# Patient Record
Sex: Female | Born: 1985
Health system: Southern US, Community
[De-identification: ages and names within clinical notes are randomized; demographics above are authoritative.]

## PROBLEM LIST (undated history)

## (undated) DIAGNOSIS — A630 Anogenital (venereal) warts: Secondary | ICD-10-CM

## (undated) DIAGNOSIS — F419 Anxiety disorder, unspecified: Secondary | ICD-10-CM

## (undated) DIAGNOSIS — K219 Gastro-esophageal reflux disease without esophagitis: Secondary | ICD-10-CM

## (undated) HISTORY — DX: Anxiety disorder, unspecified: F41.9

## (undated) HISTORY — DX: Anogenital (venereal) warts: A63.0

## (undated) HISTORY — PX: WISDOM TOOTH EXTRACTION: SHX21

---

## 2010-09-15 ENCOUNTER — Emergency Department: Payer: Self-pay | Admitting: Internal Medicine

## 2012-06-08 ENCOUNTER — Emergency Department (HOSPITAL_COMMUNITY): Payer: BC Managed Care – PPO

## 2012-06-08 ENCOUNTER — Encounter (HOSPITAL_COMMUNITY): Payer: Self-pay

## 2012-06-08 ENCOUNTER — Inpatient Hospital Stay (HOSPITAL_COMMUNITY)
Admission: EM | Admit: 2012-06-08 | Discharge: 2012-06-12 | DRG: 733 | Disposition: A | Discharge status: Rehabilitation Facility | Payer: BC Managed Care – PPO | Attending: Emergency Medicine | Admitting: Emergency Medicine

## 2012-06-08 DIAGNOSIS — S22009A Unspecified fracture of unspecified thoracic vertebra, initial encounter for closed fracture: Secondary | ICD-10-CM | POA: Diagnosis present

## 2012-06-08 DIAGNOSIS — S0181XA Laceration without foreign body of other part of head, initial encounter: Secondary | ICD-10-CM | POA: Diagnosis present

## 2012-06-08 DIAGNOSIS — S06369A Traumatic hemorrhage of cerebrum, unspecified, with loss of consciousness of unspecified duration, initial encounter: Secondary | ICD-10-CM | POA: Diagnosis present

## 2012-06-08 DIAGNOSIS — S0180XA Unspecified open wound of other part of head, initial encounter: Secondary | ICD-10-CM | POA: Diagnosis present

## 2012-06-08 DIAGNOSIS — S060X9A Concussion with loss of consciousness of unspecified duration, initial encounter: Secondary | ICD-10-CM

## 2012-06-08 DIAGNOSIS — S3681XA Injury of peritoneum, initial encounter: Principal | ICD-10-CM | POA: Diagnosis present

## 2012-06-08 DIAGNOSIS — S36115A Moderate laceration of liver, initial encounter: Secondary | ICD-10-CM | POA: Diagnosis present

## 2012-06-08 DIAGNOSIS — S2249XA Multiple fractures of ribs, unspecified side, initial encounter for closed fracture: Secondary | ICD-10-CM | POA: Diagnosis present

## 2012-06-08 DIAGNOSIS — Y998 Other external cause status: Secondary | ICD-10-CM

## 2012-06-08 DIAGNOSIS — S2243XA Multiple fractures of ribs, bilateral, initial encounter for closed fracture: Secondary | ICD-10-CM | POA: Diagnosis present

## 2012-06-08 DIAGNOSIS — S06330A Contusion and laceration of cerebrum, unspecified, without loss of consciousness, initial encounter: Secondary | ICD-10-CM | POA: Diagnosis present

## 2012-06-08 DIAGNOSIS — S36899A Unspecified injury of other intra-abdominal organs, initial encounter: Secondary | ICD-10-CM

## 2012-06-08 DIAGNOSIS — D62 Acute posthemorrhagic anemia: Secondary | ICD-10-CM | POA: Diagnosis present

## 2012-06-08 DIAGNOSIS — Z1881 Retained glass fragments: Secondary | ICD-10-CM

## 2012-06-08 DIAGNOSIS — S0636AA Traumatic hemorrhage of cerebrum, unspecified, with loss of consciousness status unknown, initial encounter: Secondary | ICD-10-CM | POA: Diagnosis present

## 2012-06-08 DIAGNOSIS — S060XAA Concussion with loss of consciousness status unknown, initial encounter: Secondary | ICD-10-CM

## 2012-06-08 DIAGNOSIS — S272XXA Traumatic hemopneumothorax, initial encounter: Secondary | ICD-10-CM | POA: Diagnosis present

## 2012-06-08 DIAGNOSIS — S20229A Contusion of unspecified back wall of thorax, initial encounter: Secondary | ICD-10-CM | POA: Diagnosis present

## 2012-06-08 DIAGNOSIS — E041 Nontoxic single thyroid nodule: Secondary | ICD-10-CM | POA: Diagnosis present

## 2012-06-08 DIAGNOSIS — S41009A Unspecified open wound of unspecified shoulder, initial encounter: Secondary | ICD-10-CM | POA: Diagnosis present

## 2012-06-08 DIAGNOSIS — I959 Hypotension, unspecified: Secondary | ICD-10-CM

## 2012-06-08 DIAGNOSIS — S27329A Contusion of lung, unspecified, initial encounter: Secondary | ICD-10-CM | POA: Diagnosis present

## 2012-06-08 DIAGNOSIS — S32009A Unspecified fracture of unspecified lumbar vertebra, initial encounter for closed fracture: Secondary | ICD-10-CM | POA: Diagnosis present

## 2012-06-08 DIAGNOSIS — S37069A Major laceration of unspecified kidney, initial encounter: Secondary | ICD-10-CM | POA: Diagnosis present

## 2012-06-08 DIAGNOSIS — S36113A Laceration of liver, unspecified degree, initial encounter: Secondary | ICD-10-CM

## 2012-06-08 DIAGNOSIS — Y92410 Unspecified street and highway as the place of occurrence of the external cause: Secondary | ICD-10-CM

## 2012-06-08 DIAGNOSIS — S37001A Unspecified injury of right kidney, initial encounter: Secondary | ICD-10-CM | POA: Diagnosis present

## 2012-06-08 DIAGNOSIS — S01119A Laceration without foreign body of unspecified eyelid and periocular area, initial encounter: Secondary | ICD-10-CM

## 2012-06-08 LAB — CBC WITH DIFFERENTIAL/PLATELET
Basophils Absolute: 0 K/uL (ref 0.0–0.1)
Basophils Relative: 0 % (ref 0–1)
Eosinophils Absolute: 0.1 K/uL (ref 0.0–0.7)
Eosinophils Relative: 1 % (ref 0–5)
HCT: 33 % — ABNORMAL LOW (ref 36.0–46.0)
Hemoglobin: 10.5 g/dL — ABNORMAL LOW (ref 12.0–15.0)
Lymphocytes Relative: 38 % (ref 12–46)
Lymphs Abs: 4 K/uL (ref 0.7–4.0)
MCH: 28.5 pg (ref 26.0–34.0)
MCHC: 31.8 g/dL (ref 30.0–36.0)
MCV: 89.4 fL (ref 78.0–100.0)
Monocytes Absolute: 0.4 K/uL (ref 0.1–1.0)
Monocytes Relative: 4 % (ref 3–12)
Neutro Abs: 6 K/uL (ref 1.7–7.7)
Neutrophils Relative %: 57 % (ref 43–77)
Platelets: 237 K/uL (ref 150–400)
RBC: 3.69 MIL/uL — ABNORMAL LOW (ref 3.87–5.11)
RDW: 13.1 % (ref 11.5–15.5)
WBC: 10.5 K/uL (ref 4.0–10.5)

## 2012-06-08 LAB — URINE MICROSCOPIC-ADD ON

## 2012-06-08 LAB — URINALYSIS, ROUTINE W REFLEX MICROSCOPIC
Bilirubin Urine: NEGATIVE
Glucose, UA: NEGATIVE mg/dL
Ketones, ur: NEGATIVE mg/dL
Leukocytes, UA: NEGATIVE
Nitrite: NEGATIVE
Protein, ur: NEGATIVE mg/dL
Specific Gravity, Urine: <1.005 — ABNORMAL LOW (ref 1.005–1.030)
Urobilinogen, UA: 0.2 mg/dL (ref 0.0–1.0)
pH: 5.5 (ref 5.0–8.0)

## 2012-06-08 LAB — ETHANOL: Alcohol, Ethyl (B): <11 mg/dL (ref 0–11)

## 2012-06-08 LAB — COMPREHENSIVE METABOLIC PANEL
ALT: 309 U/L — ABNORMAL HIGH (ref 0–35)
AST: 361 U/L — ABNORMAL HIGH (ref 0–37)
Calcium: 8.4 mg/dL (ref 8.4–10.5)
Creatinine, Ser: 0.95 mg/dL (ref 0.50–1.10)
Sodium: 135 mEq/L (ref 135–145)
Total Protein: 6.8 g/dL (ref 6.0–8.3)

## 2012-06-08 LAB — RAPID URINE DRUG SCREEN, HOSP PERFORMED
Amphetamines: NOT DETECTED
Cocaine: NOT DETECTED
Opiates: POSITIVE — AB
Tetrahydrocannabinol: NOT DETECTED

## 2012-06-08 LAB — POCT I-STAT, CHEM 8
Chloride: 104 meq/L (ref 96–112)
HCT: 36 % (ref 36.0–46.0)
Hemoglobin: 12.2 g/dL (ref 12.0–15.0)
Potassium: 4.2 meq/L (ref 3.5–5.1)
Sodium: 137 meq/L (ref 135–145)

## 2012-06-08 LAB — PREPARE RBC (CROSSMATCH)

## 2012-06-08 LAB — MRSA PCR SCREENING: MRSA by PCR: NEGATIVE

## 2012-06-08 MED ORDER — SODIUM CHLORIDE 0.9 % IV SOLN
Freq: Once | INTRAVENOUS | Status: AC
  Administered 2012-06-08: 999 mL via INTRAVENOUS

## 2012-06-08 MED ORDER — DIPHENHYDRAMINE HCL 25 MG PO CAPS
25.0000 mg | ORAL_CAPSULE | Freq: Four times a day (QID) | ORAL | Status: DC | PRN
  Filled 2012-06-08: qty 2

## 2012-06-08 MED ORDER — PSYLLIUM 95 % PO PACK
1.0000 | PACK | Freq: Every day | ORAL | Status: DC
  Administered 2012-06-09 – 2012-06-11 (×3): 1 via ORAL
  Filled 2012-06-08 (×5): qty 1

## 2012-06-08 MED ORDER — MORPHINE SULFATE 2 MG/ML IJ SOLN
1.0000 mg | INTRAMUSCULAR | Status: DC | PRN
  Administered 2012-06-08 – 2012-06-09 (×8): 2 mg via INTRAVENOUS
  Filled 2012-06-08 (×10): qty 1

## 2012-06-08 MED ORDER — WHITE PETROLATUM GEL
Status: AC
  Filled 2012-06-08: qty 5

## 2012-06-08 MED ORDER — ONDANSETRON HCL 4 MG/2ML IJ SOLN
INTRAMUSCULAR | Status: AC
  Administered 2012-06-08: 4 mg via INTRAVENOUS
  Filled 2012-06-08: qty 2

## 2012-06-08 MED ORDER — DIPHENHYDRAMINE HCL 50 MG/ML IJ SOLN: 12.5000 mg | Freq: Four times a day (QID) | INTRAMUSCULAR | Status: DC | PRN

## 2012-06-08 MED ORDER — MORPHINE SULFATE 2 MG/ML IJ SOLN
INTRAMUSCULAR | Status: AC
  Administered 2012-06-08: 4 mg via INTRAVENOUS
  Filled 2012-06-08: qty 2

## 2012-06-08 MED ORDER — IOHEXOL 300 MG/ML  SOLN
100.0000 mL | Freq: Once | INTRAMUSCULAR | Status: AC | PRN
  Administered 2012-06-08: 100 mL via INTRAVENOUS

## 2012-06-08 MED ORDER — LIDOCAINE HCL (CARDIAC) 20 MG/ML IV SOLN
INTRAVENOUS | Status: AC
  Filled 2012-06-08: qty 5

## 2012-06-08 MED ORDER — SUCCINYLCHOLINE CHLORIDE 20 MG/ML IJ SOLN
INTRAMUSCULAR | Status: AC
  Filled 2012-06-08: qty 1

## 2012-06-08 MED ORDER — ONDANSETRON HCL 4 MG PO TABS: 4.0000 mg | ORAL_TABLET | Freq: Four times a day (QID) | ORAL | Status: DC | PRN

## 2012-06-08 MED ORDER — ROCURONIUM BROMIDE 50 MG/5ML IV SOLN
INTRAVENOUS | Status: AC
  Filled 2012-06-08: qty 2

## 2012-06-08 MED ORDER — ONDANSETRON HCL 4 MG/2ML IJ SOLN: 4.0000 mg | Freq: Four times a day (QID) | INTRAMUSCULAR | Status: DC | PRN

## 2012-06-08 MED ORDER — POTASSIUM CHLORIDE IN NACL 20-0.9 MEQ/L-% IV SOLN
INTRAVENOUS | Status: DC
  Administered 2012-06-08 – 2012-06-10 (×6): via INTRAVENOUS
  Filled 2012-06-08 (×12): qty 1000

## 2012-06-08 MED ORDER — DOCUSATE SODIUM 100 MG PO CAPS
100.0000 mg | ORAL_CAPSULE | Freq: Two times a day (BID) | ORAL | Status: DC
  Administered 2012-06-08 – 2012-06-12 (×6): 100 mg via ORAL
  Filled 2012-06-08 (×10): qty 1

## 2012-06-08 MED ORDER — ETOMIDATE 2 MG/ML IV SOLN
INTRAVENOUS | Status: AC
  Filled 2012-06-08: qty 20

## 2012-06-08 NOTE — Progress Notes (Signed)
Rehab Admissions Coordinator Note:  Patient was screened by Clois Dupes for appropriateness for an Inpatient Acute Rehab Consult.  At this time, we are recommending  Await further progress before determining rehab venue needs. Pt just admitted today.   Clois Dupes, RN 06/08/2012, 3:56 PM  I can be reached at [phone].

## 2012-06-08 NOTE — ED Notes (Signed)
See Trauma Narrator
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
## 2012-06-08 NOTE — ED Notes (Signed)
PA into suture rt eye lac
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
## 2012-06-08 NOTE — ED Notes (Signed)
Back from xray family into see pt, first unit blood still infusing
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
## 2012-06-08 NOTE — ED Notes (Signed)
Emergency Release Blood started, Unit number [id number], verified with Arline Asp and Lequita Halt RN

## 2012-06-08 NOTE — ED Notes (Signed)
Vital signs stable. 
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
## 2012-06-09 ENCOUNTER — Inpatient Hospital Stay (HOSPITAL_COMMUNITY): Payer: BC Managed Care – PPO

## 2012-06-09 LAB — COMPREHENSIVE METABOLIC PANEL
ALT: 215 U/L — ABNORMAL HIGH (ref 0–35)
AST: 246 U/L — ABNORMAL HIGH (ref 0–37)
Albumin: 2.6 g/dL — ABNORMAL LOW (ref 3.5–5.2)
Alkaline Phosphatase: 40 U/L (ref 39–117)
CO2: 23 mEq/L (ref 19–32)
Chloride: 104 mEq/L (ref 96–112)
GFR calc non Af Amer: >90 mL/min (ref >90)
Potassium: 3.9 mEq/L (ref 3.5–5.1)
Sodium: 135 mEq/L (ref 135–145)
Total Bilirubin: 0.6 mg/dL (ref 0.3–1.2)

## 2012-06-09 LAB — TYPE AND SCREEN
Antibody Screen: NEGATIVE
Unit division: 0

## 2012-06-09 LAB — CBC
MCV: 86.9 fL (ref 78.0–100.0)
Platelets: 120 10*3/uL — ABNORMAL LOW (ref 150–400)
RBC: 3.06 MIL/uL — ABNORMAL LOW (ref 3.87–5.11)
RDW: 13.5 % (ref 11.5–15.5)
WBC: 7.2 10*3/uL (ref 4.0–10.5)

## 2012-06-09 MED ORDER — OXYCODONE-ACETAMINOPHEN 5-325 MG PO TABS
2.0000 | ORAL_TABLET | ORAL | Status: DC | PRN
  Administered 2012-06-09 – 2012-06-11 (×4): 2 via ORAL
  Filled 2012-06-09 (×4): qty 2

## 2012-06-09 MED ORDER — OXYCODONE HCL 5 MG PO TABS
5.0000 mg | ORAL_TABLET | ORAL | Status: DC | PRN
  Administered 2012-06-11: 5 mg via ORAL
  Filled 2012-06-09: qty 1

## 2012-06-09 NOTE — Progress Notes (Signed)
UR completed 
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
## 2012-06-09 NOTE — Progress Notes (Signed)
Patient ID: [name] [name], female   DOB: 08-15-1985, 26 y.o.   MRN: [id number] BP 124/55  Pulse 90  Temp 99.1 F (37.3 C) (Oral)  Resp 20  Ht 5\' 4"  (1.626 m)  Wt 93.8 kg (206 lb 12.7 oz)  BMI 35.50 kg/m2  SpO2 99%  LMP 05/26/2012 Alert and oriented x 4, speech is clear and fluent Perrl, full eom Symmetric facies, tongue and uvula midline Moving all extremities. Weakness expected in left lower extremity due to psoas damage and swelling on left.
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
## 2012-06-10 ENCOUNTER — Inpatient Hospital Stay (HOSPITAL_COMMUNITY): Payer: BC Managed Care – PPO

## 2012-06-10 LAB — CBC
MCV: 87.8 fL (ref 78.0–100.0)
Platelets: 123 10*3/uL — ABNORMAL LOW (ref 150–400)
RBC: 2.96 MIL/uL — ABNORMAL LOW (ref 3.87–5.11)
RDW: 13.6 % (ref 11.5–15.5)
WBC: 7.2 10*3/uL (ref 4.0–10.5)

## 2012-06-10 LAB — COMPREHENSIVE METABOLIC PANEL
ALT: 164 U/L — ABNORMAL HIGH (ref 0–35)
AST: 141 U/L — ABNORMAL HIGH (ref 0–37)
Albumin: 2.7 g/dL — ABNORMAL LOW (ref 3.5–5.2)
Alkaline Phosphatase: 46 U/L (ref 39–117)
CO2: 23 mEq/L (ref 19–32)
Chloride: 105 mEq/L (ref 96–112)
GFR calc non Af Amer: >90 mL/min (ref >90)
Potassium: 3.8 mEq/L (ref 3.5–5.1)
Total Bilirubin: 0.6 mg/dL (ref 0.3–1.2)

## 2012-06-10 LAB — URINALYSIS, MICROSCOPIC ONLY
Nitrite: POSITIVE — AB
Specific Gravity, Urine: 1.008 (ref 1.005–1.030)
Urobilinogen, UA: 1 mg/dL (ref 0.0–1.0)
pH: 6 (ref 5.0–8.0)

## 2012-06-10 MED ORDER — IPRATROPIUM BROMIDE 0.02 % IN SOLN
0.5000 mg | RESPIRATORY_TRACT | Status: DC
  Administered 2012-06-10 – 2012-06-11 (×5): 0.5 mg via RESPIRATORY_TRACT
  Filled 2012-06-10 (×5): qty 2.5

## 2012-06-10 MED ORDER — ALPRAZOLAM 0.25 MG PO TABS: 0.2500 mg | ORAL_TABLET | Freq: Three times a day (TID) | ORAL | Status: DC | PRN

## 2012-06-10 MED ORDER — ALBUTEROL SULFATE (5 MG/ML) 0.5% IN NEBU
2.5000 mg | INHALATION_SOLUTION | RESPIRATORY_TRACT | Status: DC
  Administered 2012-06-10 – 2012-06-11 (×5): 2.5 mg via RESPIRATORY_TRACT
  Filled 2012-06-10 (×5): qty 0.5

## 2012-06-10 NOTE — Progress Notes (Signed)
Agree with A&P of MD,PA . Patient seen with her. Can advance diet
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
## 2012-06-11 DIAGNOSIS — I619 Nontraumatic intracerebral hemorrhage, unspecified: Secondary | ICD-10-CM

## 2012-06-11 LAB — COMPREHENSIVE METABOLIC PANEL
AST: 90 U/L — ABNORMAL HIGH (ref 0–37)
CO2: 23 mEq/L (ref 19–32)
Calcium: 8.5 mg/dL (ref 8.4–10.5)
Creatinine, Ser: 0.57 mg/dL (ref 0.50–1.10)
GFR calc non Af Amer: >90 mL/min (ref >90)
Sodium: 137 mEq/L (ref 135–145)
Total Protein: 6.4 g/dL (ref 6.0–8.3)

## 2012-06-11 LAB — URINE CULTURE: Colony Count: >=100000

## 2012-06-11 LAB — CBC
MCH: 29.5 pg (ref 26.0–34.0)
MCHC: 33.6 g/dL (ref 30.0–36.0)
MCV: 87.8 fL (ref 78.0–100.0)
Platelets: 138 10*3/uL — ABNORMAL LOW (ref 150–400)
RDW: 13.5 % (ref 11.5–15.5)

## 2012-06-11 LAB — HEPATIC FUNCTION PANEL
Indirect Bilirubin: 0.4 mg/dL (ref 0.3–0.9)
Total Protein: 6.4 g/dL (ref 6.0–8.3)

## 2012-06-11 MED ORDER — CIPROFLOXACIN HCL 500 MG PO TABS
500.0000 mg | ORAL_TABLET | Freq: Two times a day (BID) | ORAL | Status: DC
  Administered 2012-06-11 – 2012-06-12 (×3): 500 mg via ORAL
  Filled 2012-06-11 (×5): qty 1

## 2012-06-11 MED ORDER — ALBUTEROL SULFATE (5 MG/ML) 0.5% IN NEBU: 2.5000 mg | INHALATION_SOLUTION | RESPIRATORY_TRACT | Status: DC

## 2012-06-11 MED ORDER — METHOCARBAMOL 500 MG PO TABS: 500.0000 mg | ORAL_TABLET | Freq: Three times a day (TID) | ORAL | Status: DC | PRN

## 2012-06-11 MED ORDER — IPRATROPIUM BROMIDE 0.02 % IN SOLN
0.5000 mg | Freq: Two times a day (BID) | RESPIRATORY_TRACT | Status: DC
  Administered 2012-06-11: 0.5 mg via RESPIRATORY_TRACT
  Filled 2012-06-11 (×2): qty 2.5

## 2012-06-11 MED ORDER — PROMETHAZINE HCL 12.5 MG PO TABS: 12.5000 mg | ORAL_TABLET | Freq: Four times a day (QID) | ORAL | Status: DC | PRN

## 2012-06-11 MED ORDER — OXYCODONE HCL 5 MG PO TABS: 5.0000 mg | ORAL_TABLET | ORAL | Status: DC | PRN

## 2012-06-11 MED ORDER — OXYCODONE-ACETAMINOPHEN 5-325 MG PO TABS: 1.0000 | ORAL_TABLET | ORAL | Status: DC | PRN

## 2012-06-11 MED ORDER — ALBUTEROL SULFATE (5 MG/ML) 0.5% IN NEBU
2.5000 mg | INHALATION_SOLUTION | Freq: Two times a day (BID) | RESPIRATORY_TRACT | Status: DC
  Administered 2012-06-11: 2.5 mg via RESPIRATORY_TRACT
  Filled 2012-06-11 (×2): qty 0.5

## 2012-06-11 MED ORDER — IPRATROPIUM BROMIDE 0.02 % IN SOLN: 0.5000 mg | Freq: Two times a day (BID) | RESPIRATORY_TRACT | Status: DC
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
## 2012-06-11 NOTE — Progress Notes (Signed)
Mildly anemic, will start some iron and vitamins if not already done.  Marta Lamas. Gae Bon, MD, FACS [phone] Trauma Surgeon
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
## 2012-06-11 NOTE — Progress Notes (Signed)
Occupational Therapy Treatment Patient Details Name: [name] Woods MRN: [id number] DOB: 1985-10-01 Today's Date: 06/11/2012 Time: 4098-1191 OT Time Calculation (min): 55 min  OT Assessment / Plan / Recommendation Comments on Treatment Session Excellent participation. Pt working through pain. Premedicated prior to session. Pt making progress with mobility. Pt verbalized loss of boyfirend and expressed sadness appropriately. Pt/family comforted. Will benefit from CIR.    Follow Up Recommendations  CIR    Barriers to Discharge   none    Equipment Recommendations  3 in 1 bedside comode;Tub/shower bench    Recommendations for Other Services Rehab consult  Frequency Min 2X/week   Plan Discharge plan remains appropriate    Precautions / Restrictions Precautions Precautions: Cervical;Fall Cervical Brace:  (cervical brace D/Ced) Restrictions Weight Bearing Restrictions: No   Pertinent Vitals/Pain 7. Pt given pain meds.    ADL  Eating/Feeding: Independent Where Assessed - Eating/Feeding: Chair Grooming: Moderate assistance;Wash/dry hands;Wash/dry face (lmited by pain) Where Assessed - Grooming: Unsupported sitting Upper Body Bathing: Moderate assistance Where Assessed - Upper Body Bathing: Unsupported sitting Lower Body Bathing: Maximal assistance Where Assessed - Lower Body Bathing: Supported sit to stand Upper Body Dressing: Minimal assistance Where Assessed - Upper Body Dressing: Unsupported sitting Toilet Transfer: Maximal assistance Toilet Transfer Method: Stand pivot Toilet Transfer Equipment: Bedside commode Toileting - Clothing Manipulation and Hygiene: +1 Total assistance Where Assessed - Toileting Clothing Manipulation and Hygiene: Sit to stand from 3-in-1 or toilet Equipment Used: Gait belt;Rolling walker Transfers/Ambulation Related to ADLs: Mod A sit - stand.  ADL Comments: lmited primarily by pain    OT Diagnosis:    OT Problem List:   OT Treatment  Interventions:     OT Goals Acute Rehab OT Goals OT Goal Formulation: With patient/family Time For Goal Achievement: 06/23/12 Potential to Achieve Goals: Good ADL Goals Pt Will Perform Grooming: with supervision;Standing at sink;Supported;Other (comment) ADL Goal: Grooming - Progress: Progressing toward goals Pt Will Perform Upper Body Bathing: with set-up;Unsupported;Sitting, edge of bed ADL Goal: Upper Body Bathing - Progress: Progressing toward goals Pt Will Perform Lower Body Bathing: with min assist;Sit to stand from bed;Supported;with adaptive equipment ADL Goal: Lower Body Bathing - Progress: Progressing toward goals Pt Will Transfer to Toilet: with min assist;with DME;3-in-1 ADL Goal: Toilet Transfer - Progress: Progressing toward goals Pt Will Perform Toileting - Clothing Manipulation: with min assist;Sitting on 3-in-1 or toilet;Standing ADL Goal: Toileting - Clothing Manipulation - Progress: Progressing toward goals Pt Will Perform Toileting - Hygiene: with supervision;Sitting on 3-in-1 or toilet ADL Goal: Toileting - Hygiene - Progress: Progressing toward goals Miscellaneous OT Goals Miscellaneous OT Goal #1: Pt will transition from supine to sit EOB with min assist in preparation for selfcare tasks. OT Goal: Miscellaneous Goal #1 - Progress: Progressing toward goals Miscellaneous OT Goal #2: Pt will demonstrate full shoulder AROM bilaterally for greater functional use with selfcare tasks. OT Goal: Miscellaneous Goal #2 - Progress: Progressing toward goals  Visit Information  Last OT Received On: 06/11/12 Assistance Needed: +1 PT/OT Co-Evaluation/Treatment:  (+2 for ambulation)    Subjective Data      Prior Functioning       Cognition  Overall Cognitive Status: Impaired Area of Impairment: Memory Arousal/Alertness: Awake/alert Orientation Level: Appears intact for tasks assessed Behavior During Session: [facility] for tasks performed Current Attention Level:  Selective Memory: Decreased recall of precautions Cognition - Other Comments: slow processing    Mobility  Shoulder Instructions Bed Mobility Bed Mobility: Supine to Sit;Sitting - Scoot to [employer]  Right: 3: Mod assist Right Sidelying to Sit: 3: Mod assist;HOB elevated;With rails Supine to Sit: 3: Mod assist Sitting - Scoot to Edge of Bed: 3: Mod assist Details for Bed Mobility Assistance: Pt assisted with good performance. Needs increased time and allowed to do more work independently Transfers Transfers: Sit to Stand;Stand to [employer] to Stand: 3: Mod assist;From bed;From chair/3-in-1 Stand to Sit: 3: Mod assist;With upper extremity assist;To chair/3-in-1;To toilet Details for Transfer Assistance: Better performance with sit - stand. Difficulty transfering wt via LLE. Used RW for transfer                 End of Session OT - End of Session Equipment Utilized During Treatment: Gait belt Activity Tolerance: Patient limited by pain;Patient tolerated treatment well Patient left: in chair;with call bell/phone within reach;with family/visitor present Nurse Communication: Mobility status;Other (comment) (pt verbalizing boyfriend's death)  GO     Kaitlynd Phillips,HILLARY Jun 12, 2012, 10:43 AM Luisa Dago, OTR/L  [phone] 2012-06-12
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
## 2012-06-12 ENCOUNTER — Inpatient Hospital Stay (HOSPITAL_COMMUNITY)
Admission: RE | Admit: 2012-06-12 | Discharge: 2012-06-19 | DRG: 462 | Disposition: A | Discharge status: Home / Self Care | Payer: BC Managed Care – PPO | Source: Ambulatory Visit | Attending: Physical Medicine & Rehabilitation | Admitting: Physical Medicine & Rehabilitation

## 2012-06-12 ENCOUNTER — Encounter (HOSPITAL_COMMUNITY): Payer: Self-pay | Admitting: *Deleted

## 2012-06-12 DIAGNOSIS — S22009A Unspecified fracture of unspecified thoracic vertebra, initial encounter for closed fracture: Secondary | ICD-10-CM

## 2012-06-12 DIAGNOSIS — S06330A Contusion and laceration of cerebrum, unspecified, without loss of consciousness, initial encounter: Secondary | ICD-10-CM | POA: Diagnosis present

## 2012-06-12 DIAGNOSIS — S069X9A Unspecified intracranial injury with loss of consciousness of unspecified duration, initial encounter: Secondary | ICD-10-CM

## 2012-06-12 DIAGNOSIS — S069XAA Unspecified intracranial injury with loss of consciousness status unknown, initial encounter: Secondary | ICD-10-CM

## 2012-06-12 DIAGNOSIS — S2239XA Fracture of one rib, unspecified side, initial encounter for closed fracture: Secondary | ICD-10-CM

## 2012-06-12 DIAGNOSIS — S2249XA Multiple fractures of ribs, unspecified side, initial encounter for closed fracture: Secondary | ICD-10-CM | POA: Diagnosis present

## 2012-06-12 DIAGNOSIS — D62 Acute posthemorrhagic anemia: Secondary | ICD-10-CM | POA: Diagnosis present

## 2012-06-12 DIAGNOSIS — T1490XA Injury, unspecified, initial encounter: Secondary | ICD-10-CM

## 2012-06-12 DIAGNOSIS — S32009A Unspecified fracture of unspecified lumbar vertebra, initial encounter for closed fracture: Secondary | ICD-10-CM | POA: Diagnosis present

## 2012-06-12 DIAGNOSIS — A498 Other bacterial infections of unspecified site: Secondary | ICD-10-CM | POA: Diagnosis present

## 2012-06-12 DIAGNOSIS — N390 Urinary tract infection, site not specified: Secondary | ICD-10-CM | POA: Diagnosis present

## 2012-06-12 DIAGNOSIS — S0636AA Traumatic hemorrhage of cerebrum, unspecified, with loss of consciousness status unknown, initial encounter: Secondary | ICD-10-CM

## 2012-06-12 DIAGNOSIS — S06369A Traumatic hemorrhage of cerebrum, unspecified, with loss of consciousness of unspecified duration, initial encounter: Secondary | ICD-10-CM

## 2012-06-12 DIAGNOSIS — Z5189 Encounter for other specified aftercare: Principal | ICD-10-CM

## 2012-06-12 LAB — CBC
HCT: 26.5 % — ABNORMAL LOW (ref 36.0–46.0)
Hemoglobin: 8.9 g/dL — ABNORMAL LOW (ref 12.0–15.0)
WBC: 8 10*3/uL (ref 4.0–10.5)

## 2012-06-12 MED ORDER — SORBITOL 70 % SOLN: 30.0000 mL | Freq: Every day | Status: DC | PRN

## 2012-06-12 MED ORDER — PSYLLIUM 95 % PO PACK
1.0000 | PACK | Freq: Every day | ORAL | Status: DC
  Administered 2012-06-13 – 2012-06-19 (×7): 1 via ORAL
  Filled 2012-06-12 (×8): qty 1

## 2012-06-12 MED ORDER — TRAMADOL HCL 50 MG PO TABS
50.0000 mg | ORAL_TABLET | Freq: Four times a day (QID) | ORAL | Status: DC | PRN
  Administered 2012-06-13: 100 mg via ORAL
  Administered 2012-06-13 – 2012-06-14 (×3): 50 mg via ORAL
  Administered 2012-06-15: 100 mg via ORAL
  Administered 2012-06-16 – 2012-06-18 (×4): 50 mg via ORAL
  Filled 2012-06-12: qty 1
  Filled 2012-06-12: qty 2
  Filled 2012-06-12 (×2): qty 1
  Filled 2012-06-12 (×3): qty 2
  Filled 2012-06-12 (×2): qty 1
  Filled 2012-06-12: qty 2

## 2012-06-12 MED ORDER — ACETAMINOPHEN 325 MG PO TABS: 325.0000 mg | ORAL_TABLET | ORAL | Status: DC | PRN

## 2012-06-12 MED ORDER — POLYETHYLENE GLYCOL 3350 17 G PO PACK
17.0000 g | PACK | Freq: Every day | ORAL | Status: DC | PRN
  Filled 2012-06-12: qty 1

## 2012-06-12 MED ORDER — ONDANSETRON HCL 4 MG/2ML IJ SOLN: 4.0000 mg | Freq: Four times a day (QID) | INTRAMUSCULAR | Status: DC | PRN

## 2012-06-12 MED ORDER — TRAMADOL HCL 50 MG PO TABS
50.0000 mg | ORAL_TABLET | Freq: Four times a day (QID) | ORAL | Status: DC | PRN
  Administered 2012-06-12: 50 mg via ORAL
  Filled 2012-06-12: qty 1

## 2012-06-12 MED ORDER — METHOCARBAMOL 500 MG PO TABS
500.0000 mg | ORAL_TABLET | Freq: Four times a day (QID) | ORAL | Status: DC | PRN
  Filled 2012-06-12: qty 1

## 2012-06-12 MED ORDER — ONDANSETRON HCL 4 MG PO TABS: 4.0000 mg | ORAL_TABLET | Freq: Four times a day (QID) | ORAL | Status: DC | PRN

## 2012-06-12 MED ORDER — CIPROFLOXACIN HCL 500 MG PO TABS
500.0000 mg | ORAL_TABLET | Freq: Two times a day (BID) | ORAL | Status: AC
  Administered 2012-06-12 – 2012-06-15 (×6): 500 mg via ORAL
  Filled 2012-06-12 (×7): qty 1

## 2012-06-12 MED ORDER — MORPHINE SULFATE 4 MG/ML IJ SOLN: 4.0000 mg | INTRAMUSCULAR | Status: DC | PRN

## 2012-06-12 MED ORDER — ALPRAZOLAM 0.25 MG PO TABS: 0.2500 mg | ORAL_TABLET | Freq: Three times a day (TID) | ORAL | Status: DC | PRN

## 2012-06-12 NOTE — Clinical Social Work Note (Signed)
Clinical Social Worker following for emotional support to patient and family.  Patient family would like CSW to follow up with them on inpatient rehab on Monday 12/30.  CSW will continue to relay pertinent information to inpatient rehab CSW for continued follow up.  Patient was very sleepy today with many visitors and was not emotionally able to fully engage in SBIRT assessment.  Patient family is very religious and there was not report of alcohol use or concerns.  Patient with great family support during hospitalization and for discharge home.  Patient seems to be coping very well under current circumstances.  CSW to follow up briefly with patient family 12/30 on 4000.  Clinical Social Worker will sign off for now as social work intervention is no longer needed. Please consult [place] again if new need arises.  Macario Golds, [place] [phone]

## 2012-06-12 NOTE — Progress Notes (Signed)
Rehab admissions - I met with mom at the bedside.  Patient slept through our discussion.  I explained inpatient rehab to mom.  Mom is in favor of inpatient rehab admission.  Bed available and can admit to inpatient rehab today.  Call me for questions.  #[phone]
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
## 2012-06-12 NOTE — Progress Notes (Signed)
PT Cancellation Note  Patient Details Name: [name] Woods MRN: [id number] DOB: 05/22/1986   Cancelled Treatment:    Upon entering room pt had just lied down in bed and mom was adjusting covers after going to bathroom and sitting up at edge of bed for last 30 minutes to eat. Pt agreeable to getting back up later this am/pm with PT once pain decreases if she is not at rehab by that time. Will follow up as time allows today vs tomorrow. Pt remains motivated to get better, per mom did well with walking to bathroom with walker.  Sallyanne Kuster 06/12/2012, 9:47 AM  Sallyanne Kuster, PTA Office- [phone]

## 2012-06-12 NOTE — Progress Notes (Signed)
Agree with above PA Jeffery's note.  Con't pulm toilet Hopefully to rehab today.

## 2012-06-12 NOTE — Interval H&P Note (Signed)
[name] Woods was admitted today to Inpatient Rehabilitation with the diagnosis of TBI and polytrauma.  The patient's history has been reviewed, patient examined, and there is no change in status.  Patient continues to be appropriate for intensive inpatient rehabilitation.  I have reviewed the patient's chart and labs.  Questions were answered to the patient's satisfaction.  [name] T 06/12/2012, 8:58 PM

## 2012-06-12 NOTE — Progress Notes (Signed)
Report given to Darel Hong, RN ([phone]).
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
## 2012-06-12 NOTE — Progress Notes (Signed)
Patient transferred to 4000.

## 2012-06-12 NOTE — Progress Notes (Signed)
Physical Therapy Treatment Patient Details Name: [name] Woods MRN: [id number] DOB: [birth year] Today's Date: 06/12/2012 Time: 7829-5621 PT Time Calculation (min): 25 min  PT Assessment / Plan / Recommendation Comments on Treatment Session  Pt progressing well, able to ambulate increased distance this session with less pain. Pt d/c'ed to CIR this afternoon    Follow Up Recommendations  Supervision/Assistance - 24 hour;CIR;Home health PT     Does the patient have the potential to tolerate intense rehabilitation     Barriers to Discharge        Equipment Recommendations  Other (comment)    Recommendations for Other Services    Frequency Min 5X/week   Plan Discharge plan remains appropriate;Frequency remains appropriate    Precautions / Restrictions Precautions Precautions: Cervical;Fall Restrictions Weight Bearing Restrictions: No   Pertinent Vitals/Pain Pain 5/10. RN aware. Pain meds given prior to session    Mobility  Bed Mobility Bed Mobility: Supine to Sit;Sitting - Scoot to Edge of Bed Supine to Sit: 4: Min assist Details for Bed Mobility Assistance: Min assist through trunk for support into sitting. Cues for sequencing Transfers Transfers: Sit to Stand;Stand to Sit Sit to Stand: 4: Min assist;With upper extremity assist;From bed;From toilet Stand to Sit: 4: Min assist;With upper extremity assist;To toilet Details for Transfer Assistance: Assist for support into standing. No buckling at this time Ambulation/Gait Ambulation/Gait Assistance: 4: Min assist Ambulation Distance (Feet): 50 Feet Assistive device: rollin Ambulation/Gait Assistance Details: Assist for stability. Rest break required and then another 50 ft of ambulation. Cues for fluid gait pattern as left leg initiation limited secondary t pain Gait Pattern: Step-through pattern;Decreased stride length;Shuffle;Antalgic;Trunk flexed Gait velocity: slow gait speed Stairs: No    Exercises     PT  Diagnosis:    PT Problem List:   PT Treatment Interventions:     PT Goals Acute Rehab PT Goals PT Goal: Supine/Side to Sit - Progress: Progressing toward goal PT Goal: Sit to Supine/Side - Progress: Progressing toward goal PT Goal: Sit to Stand - Progress: Progressing toward goal PT Goal: Stand to Sit - Progress: Progressing toward goal PT Goal: Ambulate - Progress: Progressing toward goal  Visit Information  Last PT Received On: 06/12/12 Assistance Needed: +1    Subjective Data      Cognition  Overall Cognitive Status: Appears within functional limits for tasks assessed/performed Arousal/Alertness: Awake/alert Orientation Level: Appears intact for tasks assessed Behavior During Session: [facility] for tasks performed Cognition - Other Comments: Pt with improvements in cognition    Balance     End of Session PT - End of Session Equipment Utilized During Treatment: Gait belt Activity Tolerance: Patient limited by pain Patient left: in chair;with call bell/phone within reach;with family/visitor present Nurse Communication: Mobility status   GP     Milana Kidney 06/12/2012, 5:20 PM

## 2012-06-12 NOTE — Progress Notes (Signed)
Patient refused immunizations.

## 2012-06-12 NOTE — Progress Notes (Signed)
Arrived in hospital bed. From 6N.  Family with. Good family support

## 2012-06-13 ENCOUNTER — Inpatient Hospital Stay (HOSPITAL_COMMUNITY): Payer: Self-pay

## 2012-06-13 ENCOUNTER — Inpatient Hospital Stay (HOSPITAL_COMMUNITY): Payer: Self-pay | Admitting: Speech Pathology

## 2012-06-13 ENCOUNTER — Inpatient Hospital Stay (HOSPITAL_COMMUNITY): Payer: Self-pay | Admitting: Occupational Therapy

## 2012-06-13 DIAGNOSIS — S2239XA Fracture of one rib, unspecified side, initial encounter for closed fracture: Secondary | ICD-10-CM

## 2012-06-13 DIAGNOSIS — S069X9A Unspecified intracranial injury with loss of consciousness of unspecified duration, initial encounter: Secondary | ICD-10-CM

## 2012-06-13 DIAGNOSIS — S22009A Unspecified fracture of unspecified thoracic vertebra, initial encounter for closed fracture: Secondary | ICD-10-CM

## 2012-06-13 DIAGNOSIS — S069XAA Unspecified intracranial injury with loss of consciousness status unknown, initial encounter: Secondary | ICD-10-CM
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
## 2012-06-13 NOTE — Progress Notes (Signed)
Subjective/Complaints: Had a reasonable night. Some pain in left rib cage and hip. Pain meds are effective  Objective: Vital Signs: Blood pressure 155/74, pulse 89, temperature 97.8 F (36.6 C), temperature source Oral, resp. rate 20, weight 98.5 kg (217 lb 2.5 oz), last menstrual period 05/26/2012, SpO2 97.00%. No results found.  Basename 06/12/12 0850 06/11/12 0915  WBC 8.0 9.2  HGB 8.9* 8.5*  HCT 26.5* 25.3*  PLT 202 138*    Basename 06/11/12 0915  NA 137  K 4.0  CL 103  GLUCOSE 103*  BUN 6  CREATININE 0.57  CALCIUM 8.5   CBG (last 3)  No results found for this basename: GLUCAP:3 in the last 72 hours  Wt Readings from Last 3 Encounters:  06/12/12 98.5 kg (217 lb 2.5 oz)  06/08/12 93.8 kg (206 lb 12.7 oz)    Physical Exam:  Constitutional: She is oriented to person, place, and time.  HENT:  Head: Normocephalic. Oral mucos paink  Eyes:  Pupils round and reactive to light  Neck: Neck supple. No thyromegaly present.  Cardiovascular: Normal rate and regular rhythm. No murmur,  Pulmonary/Chest: Effort normal and breath sounds normal. She has no wheezes.  Abdominal: Soft. Bowel sounds are normal. She exhibits no distension.  Skin:  Multiple healing abrasions and lacerations. Sutures over right eye, with wounds intact and without drainage. Psychiatric:  Patient with flat affect.  Neuro:   Is oriented to hospital and why she is here. She knows date. No CN abnl  Motor strength is 5/5 in bilateral biceps triceps grip 4/5 in the deltoid limited by pain  4 minus/5 in the right hip flexor knee extensor ankle dorsiflexor and plantar flexor 2 minus in the left hip flexors 3 minus in the knee extensors 4 minus in ankle dorsiflexor plantar flexor. (pain also a factor)  Sensation is normal in the upper limbs and the lower limbs it appears to be generally intact as well. dtr's grossly intact   Assessment/Plan: 1. Functional deficits secondary to polytrauma with TBI (right frontal  lobe), multiple left-sided rib fx's, thoracic TP fx's, left thigh weakness which I believe is pain induced related to multiple left-sided rib fx's,  which require 3+ hours per day of interdisciplinary therapy in a comprehensive inpatient rehab setting. Physiatrist is providing close team supervision and 24 hour management of active medical problems listed below. Physiatrist and rehab team continue to assess barriers to discharge/monitor patient progress toward functional and medical goals. FIM:                   Comprehension Comprehension Mode: Auditory Comprehension: 6-Follows complex conversation/direction: With extra time/assistive device  Expression Expression Mode: Verbal Expression: 6-Expresses complex ideas: With extra time/assistive device  Social Interaction Social Interaction: 5-Interacts appropriately 90% of the time - Needs monitoring or encouragement for participation or interaction.        Medical Problem List and Plan:  1. Polytrauma with traumatic brain injury/hemorrhagic contusion right frontal lobe, multiple left-sided of fractures and transverse process fractures  2. DVT Prophylaxis/Anticoagulation: SCDs. Monitor for signs of DVT  3.. Pain Management: Ultram and Robaxin as needed. Ultram effective at present 4. Neuropsych: This patient is capable of making decisions on his/her own behalf.  5. Escherichia coli urinary tract infection. Cipro initiated 06/11/2012  6. Hospital course ABL. Latest hemoglobin 8.5 and monitored. Followup CBC  LOS (Days) 1 A FACE TO FACE EVALUATION WAS PERFORMED  Jaylenne Hamelin T 06/13/2012 7:13 AM
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
## 2012-06-14 ENCOUNTER — Inpatient Hospital Stay (HOSPITAL_COMMUNITY): Payer: Self-pay | Admitting: Physical Therapy

## 2012-06-14 ENCOUNTER — Inpatient Hospital Stay (HOSPITAL_COMMUNITY): Payer: BC Managed Care – PPO | Admitting: Occupational Therapy

## 2012-06-14 DIAGNOSIS — T1490XA Injury, unspecified, initial encounter: Secondary | ICD-10-CM

## 2012-06-14 NOTE — Progress Notes (Signed)
Subjective/Complaints: Had a good day. Urine still frequent but otherwise no major issues. Pain is controlled. A 12 point review of systems has been performed and if not noted above is otherwise negative.   Objective: Vital Signs: Blood pressure 114/74, pulse 102, temperature 98.6 F (37 C), temperature source Oral, resp. rate 18, weight 98.5 kg (217 lb 2.5 oz), last menstrual period 05/26/2012, SpO2 99.00%. No results found.  Basename 06/12/12 0850 06/11/12 0915  WBC 8.0 9.2  HGB 8.9* 8.5*  HCT 26.5* 25.3*  PLT 202 138*    Basename 06/11/12 0915  NA 137  K 4.0  CL 103  GLUCOSE 103*  BUN 6  CREATININE 0.57  CALCIUM 8.5   CBG (last 3)  No results found for this basename: GLUCAP:3 in the last 72 hours  Wt Readings from Last 3 Encounters:  06/12/12 98.5 kg (217 lb 2.5 oz)  06/08/12 93.8 kg (206 lb 12.7 oz)    Physical Exam:  Constitutional: She is oriented to person, place, and time.  HENT:  Head: Normocephalic. Oral mucos paink  Eyes:  Pupils round and reactive to light  Neck: Neck supple. No thyromegaly present.  Cardiovascular: Normal rate and regular rhythm. No murmur,  Pulmonary/Chest: Effort normal and breath sounds normal. She has no wheezes.  Abdominal: Soft. Bowel sounds are normal. She exhibits no distension.  Skin:  Multiple healing abrasions and lacerations. Sutures over right eye, with wounds intact and without drainage. Psychiatric:  Patient with flat affect.  Neuro:   Is oriented to hospital and why she is here. She knows date. No CN abnl  Motor strength is 5/5 in bilateral biceps triceps grip 4/5 in the deltoid limited by pain  4 minus/5 in the right hip flexor knee extensor ankle dorsiflexor and plantar flexor 2 minus in the left hip flexors 3 minus in the knee extensors 4 minus in ankle dorsiflexor plantar flexor. (pain also a factor)  Sensation is normal in the upper limbs and the lower limbs it appears to be generally intact as well. dtr's grossly  intact   Assessment/Plan: 1. Functional deficits secondary to polytrauma with TBI (right frontal lobe), multiple left-sided rib fx's, thoracic TP fx's, left thigh weakness which I believe is pain induced related to multiple left-sided rib fx's,  which require 3+ hours per day of interdisciplinary therapy in a comprehensive inpatient rehab setting. Physiatrist is providing close team supervision and 24 hour management of active medical problems listed below. Physiatrist and rehab team continue to assess barriers to discharge/monitor patient progress toward functional and medical goals. FIM: FIM - Bathing Bathing Steps Patient Completed: Chest;Right Arm;Left Arm;Abdomen;Front perineal area;Buttocks;Right upper leg;Left upper leg Bathing: 4: Min-Patient completes 8-9 51f 10 parts or 75+ percent  FIM - Upper Body Dressing/Undressing Upper body dressing/undressing steps patient completed: Thread/unthread right sleeve of front closure shirt/dress;Button/unbutton shirt Upper body dressing/undressing: 3: Mod-Patient completed 50-74% of tasks FIM - Lower Body Dressing/Undressing Lower body dressing/undressing steps patient completed: Pull underwear up/down Lower body dressing/undressing: 2: Max-Patient completed 25-49% of tasks  FIM - Toileting Toileting steps completed by patient: Adjust clothing prior to toileting;Performs perineal hygiene;Adjust clothing after toileting Toileting Assistive Devices: Grab bar or rail for support Toileting: 4: Steadying assist  FIM - Archivist Transfers: 4-To toilet/BSC: Min A (steadying Pt. > 75%);4-From toilet/BSC: Min A (steadying Pt. > 75%)  FIM - Bed/Chair Transfer Bed/Chair Transfer: 3: Supine > Sit: Mod A (lifting assist/Pt. 50-74%/lift 2 legs;4: Bed > Chair or W/C: Min A (steadying Pt. >  75%)  FIM - Locomotion: Wheelchair Distance: 100 Locomotion: Wheelchair: 2: Travels 50 - 149 ft with moderate assistance (Pt: 50 - 74%) FIM - Locomotion:  Ambulation Locomotion: Ambulation Assistive Devices: Other (comment) (none) Ambulation/Gait Assistance: 3: Mod assist Locomotion: Ambulation: 1: Travels less than 50 ft with moderate assistance (Pt: 50 - 74%)  Comprehension Comprehension Mode: Auditory Comprehension: 5-Follows basic conversation/direction: With no assist  Expression Expression Mode: Verbal Expression: 6-Expresses complex ideas: With extra time/assistive device  Social Interaction Social Interaction: 6-Interacts appropriately with others with medication or extra time (anti-anxiety, antidepressant).  Problem Solving Problem Solving: 6-Solves complex problems: With extra time  Memory Memory: 3-Recognizes or recalls 50 - 74% of the time/requires cueing 25 - 49% of the time  Medical Problem List and Plan:  1. Polytrauma with traumatic brain injury/hemorrhagic contusion right frontal lobe, multiple left-sided of fractures and transverse process fractures  2. DVT Prophylaxis/Anticoagulation: SCDs. Monitor for signs of DVT  3.. Pain Management: Ultram and Robaxin as needed. Ultram effective at present 4. Neuropsych: This patient is capable of making decisions on his/her own behalf.  5. Escherichia coli urinary tract infection. Cipro initiated 06/11/2012  6. Hospital course ABL. Latest hemoglobin 8.5 and monitored. Followup CBC 7. Increased LFT"s. Recheck tomorrow, they are already trending down.  LOS (Days) 2 A FACE TO FACE EVALUATION WAS PERFORMED  SWARTZ,ZACHARY T 06/14/2012 7:36 AM

## 2012-06-14 NOTE — Progress Notes (Signed)
Physical Therapy Session Note  Patient Details  Name: [name] Woods MRN: [id number] Date of Birth: December 16, 1985  Today's Date: 06/14/2012 Time: 8469-6295 Time Calculation (min): 45 min  Short Term Goals: Week 1:  PT Short Term Goal 1 (Week 1): Pt will perform all bed mobility modified independently. PT Short Term Goal 2 (Week 1): Pt will perform transfers with supervision. PT Short Term Goal 3 (Week 1): Pt will perform gait with LRAD x 150' with supervision. PT Short Term Goal 4 (Week 1): Pt will ascend/descend 5 steps with 2 rails with supervision.  Skilled Therapeutic Interventions/Progress Updates:    pt notes pain a little better this pm, but neck is still very stiff.  Pt ed on gentle ROM activities.  Nustep on level 3 x with pt using arms for only of time secondary to increased neck and shoulder stiffness.  Pt Sit to stand MinA at once was MinGuard.  Chair push-ups with Bil UEs and LEs x10.  Ambulate MinGuard with RW 90' with 2 standing rest breaks.  Pt moving very slowly, but with better stability and endurance with RW.  Pt indicates feeling better having UE support during mobility.    Therapy Documentation Precautions:  Precautions Precautions: Fall Restrictions Weight Bearing Restrictions: No See FIM for current functional status  Therapy/Group: Individual Therapy  [name] Woods, Alison Murray 06/14/2012, 3:11 PM

## 2012-06-14 NOTE — Progress Notes (Signed)
Occupational Therapy Session Notes  Patient Details  Name: Rylynn Kobs MRN: [id number] Date of Birth: 02/14/1986  Today's Date: 06/14/2012  Short Term Goals = Long Term Goals  Skilled Therapeutic Interventions/Progress Updates:   Session #1 [phone] - 45 Minutes Individual Therapy Patient with 7/10 complaints of pain, RN made aware Patient found seated on elevated toilet seat upon entering room. Therapist assisted patient off toilet seat and with peri care. Patient then ambulated from toilet -> padded tub bench set-up in shower stall. Patient performed UB/LB bathing at shower level. Patient transferred out of shower into w/c with min assist, then engaged in UB/LB dressing from w/c. Focused skilled intervention on UB/LB bathing & dressing, sit/stands, various transfers, overall activity tolerance/endurance, pain management, memory, and problem solving. Patient's mother present during entire session. At end of session left patient seated in w/c beside bed with call bell & phone within reach.   Session #2 [id number] - 30 Minutes Individual Therapy Patient with complaints of pain during transitional movements Patient found seated in w/c in room. Therapist propelled patient from room -> ADL apartment for functional ambulation/mobility using rolling walker, education on safety with w/c, bed mobility, and simulated tub/shower transfer using tub transfer bench. At end of session, therapist propelled patient back to room and left seated in w/c with multiple family & friends present in room.   Precautions:  Precautions Precautions: Fall Restrictions Weight Bearing Restrictions: No  See FIM for current functional status  Lamoyne Palencia 06/14/2012, 7:55 AM

## 2012-06-14 NOTE — Progress Notes (Signed)
Physical Therapy Session Note  Patient Details  Name: [name] Woods MRN: [id number] Date of Birth: Jul 03, 1985  Today's Date: 06/14/2012 Time: 1115- 1200 Time Calculation (min): 30 min  Short Term Goals: Week 1:  PT Short Term Goal 1 (Week 1): Pt will perform all bed mobility modified independently. PT Short Term Goal 2 (Week 1): Pt will perform transfers with supervision. PT Short Term Goal 3 (Week 1): Pt will perform gait with LRAD x 150' with supervision. PT Short Term Goal 4 (Week 1): Pt will ascend/descend 5 steps with 2 rails with supervision.  Skilled Therapeutic Interventions/Progress Updates:    pt indicates pain in neck, L ribs, and L LE, but states she just took pain meds for this.  Sit to stand ModA initially then, by end of session was MinA.  Cues for anterior wt shift.  Amb with L HHA 16' and 20', fluctuating between Min and ModA for balance.  Seated there ex Bil LAQs, Seated Marching, Hip Adduction, and APs x 20 each.    Therapy Documentation Precautions:  Precautions Precautions: Fall Restrictions Weight Bearing Restrictions: No  See FIM for current functional status  Therapy/Group: Individual Therapy  [name], Alison Murray 06/14/2012, 3:03 PM

## 2012-06-15 ENCOUNTER — Inpatient Hospital Stay (HOSPITAL_COMMUNITY): Payer: BC Managed Care – PPO

## 2012-06-15 ENCOUNTER — Encounter (HOSPITAL_COMMUNITY): Payer: BC Managed Care – PPO

## 2012-06-15 ENCOUNTER — Inpatient Hospital Stay (HOSPITAL_COMMUNITY): Payer: BC Managed Care – PPO | Admitting: Physical Therapy

## 2012-06-15 ENCOUNTER — Inpatient Hospital Stay (HOSPITAL_COMMUNITY): Payer: BC Managed Care – PPO | Admitting: Occupational Therapy

## 2012-06-15 DIAGNOSIS — S069XAA Unspecified intracranial injury with loss of consciousness status unknown, initial encounter: Secondary | ICD-10-CM

## 2012-06-15 DIAGNOSIS — S069X9A Unspecified intracranial injury with loss of consciousness of unspecified duration, initial encounter: Secondary | ICD-10-CM

## 2012-06-15 DIAGNOSIS — IMO0002 Reserved for concepts with insufficient information to code with codable children: Secondary | ICD-10-CM

## 2012-06-15 DIAGNOSIS — S22009A Unspecified fracture of unspecified thoracic vertebra, initial encounter for closed fracture: Secondary | ICD-10-CM

## 2012-06-15 DIAGNOSIS — S2239XA Fracture of one rib, unspecified side, initial encounter for closed fracture: Secondary | ICD-10-CM

## 2012-06-15 LAB — COMPREHENSIVE METABOLIC PANEL
ALT: 70 U/L — ABNORMAL HIGH (ref 0–35)
AST: 41 U/L — ABNORMAL HIGH (ref 0–37)
Albumin: 3.4 g/dL — ABNORMAL LOW (ref 3.5–5.2)
CO2: 22 mEq/L (ref 19–32)
Calcium: 9.3 mg/dL (ref 8.4–10.5)
Chloride: 99 mEq/L (ref 96–112)
Creatinine, Ser: 0.58 mg/dL (ref 0.50–1.10)
GFR calc non Af Amer: >90 mL/min (ref >90)
Sodium: 136 mEq/L (ref 135–145)
Total Bilirubin: 1.1 mg/dL (ref 0.3–1.2)

## 2012-06-15 LAB — CBC WITH DIFFERENTIAL/PLATELET
Basophils Absolute: 0 10*3/uL (ref 0.0–0.1)
Eosinophils Relative: 3 % (ref 0–5)
Lymphocytes Relative: 17 % (ref 12–46)
Lymphs Abs: 1.9 10*3/uL (ref 0.7–4.0)
Monocytes Relative: 6 % (ref 3–12)
Neutrophils Relative %: 74 % (ref 43–77)
Platelets: 356 10*3/uL (ref 150–400)
RBC: 3.43 MIL/uL — ABNORMAL LOW (ref 3.87–5.11)
RDW: 14.2 % (ref 11.5–15.5)
WBC: 11.4 10*3/uL — ABNORMAL HIGH (ref 4.0–10.5)

## 2012-06-15 NOTE — Progress Notes (Signed)
Occupational Therapy Session Note  Patient Details  Name: [name] Woods MRN: [id number] Date of Birth: 12-01-1985  Today's Date: 06/15/2012  Session 1 Time: 1000-1054 Time Calculation (min): 54 min  Short Term Goals: Week 1:   STG=LTG secondary to ELOS  Skilled Therapeutic Interventions/Progress Updates:    Pt sitting in w/c upon arrival.  Pt amb with RW to shower for bathing with sit<>stand from tub bench.  Pt required assistance with bathing feet without AE.  Pt completed dressing tasks with sit<>stand from w/c with assistance to don left sock.  Pt required extra time to complete all tasks and is very deliberate with all transitional movements.  Focus on activity tolerance, standing balance, functional ambulation with RW, and safety awareness.  Pt's mom present during therapy session.  Therapy Documentation Precautions:  Precautions Precautions: Fall Restrictions Weight Bearing Restrictions: No   Pain: Pain Assessment Pain Assessment: 0-10 Pain Score:   6 Pain Type: Acute pain Pain Location: Rib cage Pain Orientation: Left Pain Descriptors: Aching Pain Frequency: Occasional Pain Onset: With Activity Patients Stated Pain Goal: 2 Pain Intervention(s): RN made aware (per pt premedicated before therapy session)  See FIM for current functional status  Therapy/Group: Individual Therapy  Session 2 Time: 1330-1415 Pt denies pain Individual Therapy Pt's resting in bed upon arrival but agreeable to participating in therapy session.  Pt's mom and dad present to observe.  Pt requested to use toilet before continuing and amb with RW to bathroom completing all tasks at supervision level.  Pt amb with RW to gym to practice bed mobility.  Pt initially required use of sheet to lift LLE into and out of elevated mat.  After practice pt did not require use of sheet to assist with LLE.  Pt practiced sit<>stand from lower surface without use of BUE to simulate standing from toilet without  grab bars or surface to push up from.  After practice pt able to complete task without assistance.  Focus on functional ambulation, bed mobility, sit<>stand, and safety awareness.  Lavone Neri [facility] 06/15/2012, 10:55 AM

## 2012-06-15 NOTE — Progress Notes (Signed)
Physical Therapy Session Note  Patient Details  Name: [name] Woods MRN: [id number] Date of Birth: [birth year]  Today's Date: 06/15/2012 Time: 7829-5621 Time Calculation (min): 52 min  Short Term Goals: Week 1:  PT Short Term Goal 1 (Week 1): Pt will perform all bed mobility modified independently. PT Short Term Goal 2 (Week 1): Pt will perform transfers with supervision. PT Short Term Goal 3 (Week 1): Pt will perform gait with LRAD x 150' with supervision. PT Short Term Goal 4 (Week 1): Pt will ascend/descend 5 steps with 2 rails with supervision.  Skilled Therapeutic Interventions/Progress Updates:    This session focused on gait with RW 150' with supervision, 2 standing rest breaks and one sitting rest break due to pain in left lower back and side.  Cues for upright posture.  Stairs with bil handrails 5 steps x 2 4-6" in height.  Verbal cues for safest leg sequencing.  Bed mobility in ADL apartment with supervision to go from sit to supine and then min assist to get from supine to sit in a bed without bed rails.  Pt reports she has a really high bed at home, so we simulated getting on and off of a high bed with step stool and RW with supervision for safety.  WC mobility >300' with bil arms alternating bil legs (due to arm fatigue) over tiled and carpeted surfaces with supervision.      Therapy Documentation Precautions:  Precautions Precautions: Fall Restrictions Weight Bearing Restrictions: No    Pain: Pain Assessment Pain Assessment: 0-10 Pain Score:   6 Pain Type: Acute pain Pain Location: Rib cage Pain Orientation: Left Pain Descriptors: Aching Pain Frequency: Occasional Pain Onset: With Activity Patients Stated Pain Goal: 2 Pain Intervention(s): RN made aware (per pt premedicated before therapy session) Mobility:   Locomotion : Ambulation Ambulation/Gait Assistance: 5: Supervision Wheelchair Mobility Distance: 150   See FIM for current functional  status  Therapy/Group: Individual Therapy  Lurena Joiner B. Chereese Cilento, PT, DPT [phone]   06/15/2012, 11:26 AM

## 2012-06-15 NOTE — Progress Notes (Signed)
Subjective/Complaints: Urine frequent but no other complaints today. A 12 point review of systems has been performed and if not noted above is otherwise negative.   Objective: Vital Signs: Blood pressure 106/69, pulse 99, temperature 98.2 F (36.8 C), temperature source Oral, resp. rate 18, weight 98.5 kg (217 lb 2.5 oz), last menstrual period 05/26/2012, SpO2 98.00%. No results found.  Basename 06/12/12 0850  WBC 8.0  HGB 8.9*  HCT 26.5*  PLT 202   No results found for this basename: NA:2,K:2,CL:2,CO:2,GLUCOSE:2,BUN:2,CREATININE:2,CALCIUM:2 in the last 72 hours CBG (last 3)  No results found for this basename: GLUCAP:3 in the last 72 hours  Wt Readings from Last 3 Encounters:  06/12/12 98.5 kg (217 lb 2.5 oz)  06/08/12 93.8 kg (206 lb 12.7 oz)    Physical Exam:  Constitutional: She is oriented to person, place, and time.  HENT:  Head: Normocephalic. Oral mucos paink  Eyes:  Pupils round and reactive to light  Neck: Neck supple. No thyromegaly present.  Cardiovascular: Normal rate and regular rhythm. No murmur,  Pulmonary/Chest: Effort normal and breath sounds normal. She has no wheezes.  Abdominal: Soft. Bowel sounds are normal. She exhibits no distension.  Skin:  Multiple healing abrasions and lacerations. Sutures over right eye, with wounds intact and without drainage. Psychiatric:  Patient with flat affect.  Neuro:   Is oriented to hospital and why she is here. She knows date. No CN abnl  Motor strength is 5/5 in bilateral biceps triceps grip 4/5 in the deltoid limited by pain  4 minus/5 in the right hip flexor knee extensor ankle dorsiflexor and plantar flexor . Left hip and knee inhibited by pain. 4 minus in ankle dorsiflexor plantar flexor. (pain also a factor)  Sensation is normal in the upper limbs and the lower limbs it appears to be generally intact as well. dtr's grossly intact   Assessment/Plan: 1. Functional deficits secondary to polytrauma with TBI (right  frontal lobe), multiple left-sided rib fx's, thoracic TP fx's, left thigh weakness which I believe is pain induced related to multiple left-sided rib fx's,  which require 3+ hours per day of interdisciplinary therapy in a comprehensive inpatient rehab setting. Physiatrist is providing close team supervision and 24 hour management of active medical problems listed below. Physiatrist and rehab team continue to assess barriers to discharge/monitor patient progress toward functional and medical goals. FIM: FIM - Bathing Bathing Steps Patient Completed: Chest;Right Arm;Left Arm;Abdomen;Front perineal area;Buttocks;Right upper leg;Left upper leg Bathing: 4: Min-Patient completes 8-9 9f 10 parts or 75+ percent  FIM - Upper Body Dressing/Undressing Upper body dressing/undressing steps patient completed: Thread/unthread right sleeve of pullover shirt/dresss;Thread/unthread left sleeve of pullover shirt/dress;Put head through opening of pull over shirt/dress;Pull shirt over trunk Upper body dressing/undressing: 5: Set-up assist to: Obtain clothing/put away FIM - Lower Body Dressing/Undressing Lower body dressing/undressing steps patient completed: Pull underwear up/down Lower body dressing/undressing: 1: Total-Patient completed less than 25% of tasks  FIM - Toileting Toileting steps completed by patient: Adjust clothing prior to toileting;Performs perineal hygiene;Adjust clothing after toileting Toileting Assistive Devices: Grab bar or rail for support Toileting: 4: Steadying assist  FIM - Diplomatic Services operational officer Devices: Elevated toilet seat;Grab bars Toilet Transfers: 4-From toilet/BSC: Min A (steadying Pt. > 75%)  FIM - Bed/Chair Transfer Bed/Chair Transfer Assistive Devices: Therapist, occupational: 4: Bed > Chair or W/C: Min A (steadying Pt. > 75%);4: Chair or W/C > Bed: Min A (steadying Pt. > 75%)  FIM - Locomotion: Wheelchair Distance: 100 Locomotion: Wheelchair:  0:  Activity did not occur FIM - Locomotion: Ambulation Locomotion: Ambulation Assistive Devices: Designer, industrial/product Ambulation/Gait Assistance: 4: Min assist Locomotion: Ambulation: 2: Travels 50 - 149 ft with minimal assistance (Pt.>75%)  Comprehension Comprehension Mode: Auditory Comprehension: 5-Follows basic conversation/direction: With no assist  Expression Expression Mode: Verbal Expression: 6-Expresses complex ideas: With extra time/assistive device  Social Interaction Social Interaction: 6-Interacts appropriately with others with medication or extra time (anti-anxiety, antidepressant).  Problem Solving Problem Solving: 6-Solves complex problems: With extra time  Memory Memory: 3-Recognizes or recalls 50 - 74% of the time/requires cueing 25 - 49% of the time  Medical Problem List and Plan:  1. Polytrauma with traumatic brain injury/hemorrhagic contusion right frontal lobe, multiple left-sided of fractures and transverse process fractures  2. DVT Prophylaxis/Anticoagulation: SCDs. Monitor for signs of DVT  3.. Pain Management: Ultram and Robaxin as needed. Ultram effective at present 4. Neuropsych: This patient is capable of making decisions on his/her own behalf.  5. Escherichia coli urinary tract infection. Cipro initiated 06/11/2012. Scheduled voids. Encouraged patient that sx should improve 6. Hospital course ABL. Latest hemoglobin 8.5 and monitored. Followup CBC 7. Increased LFT"s. Recheck today  LOS (Days) 3 A FACE TO FACE EVALUATION WAS PERFORMED  Tahj Njoku T 06/15/2012 6:55 AM

## 2012-06-15 NOTE — Progress Notes (Signed)
Patient information reviewed and entered into [facility] system by Demetress Tift, RN, CRRN, PPS Coordinator.  Information including medical coding and functional independence measure will be reviewed and updated through discharge.    

## 2012-06-15 NOTE — Progress Notes (Signed)
Speech Language Pathology Daily Session Note  Patient Details  Name: [name] Woods MRN: [id number] Date of Birth: 02-Mar-1986  Today's Date: 06/15/2012 Time: 1305-1330 Time Calculation (min): 25 min  Short Term Goals: Week 1: SLP Short Term Goal 1 (Week 1): Pt. will demonstrate recall of newly learned information and prospective memory with supervision assist. SLP Short Term Goal 1 - Progress (Week 1): Progressing toward goal SLP Short Term Goal 2 (Week 1): Pt. will demonstrate initiative and engage in planning for home discharge considering potential chanllenges with supervision assist.  SLP Short Term Goal 2 - Progress (Week 1): Progressing toward goal  Skilled Therapeutic Interventions: Pt. seen with focus on working and prospective memory with mother present for majority of session.  Pt. alert and continues with mild "fogginess" due to pain medication.  Pt. recalled and described technique learned in during PT/OT (?) session this am to assist with supine to sit in the bed  (no bed rails, ADL room).  She required min verbal cues to clarify sequence/steps.  Pt. stated strategies to facilitate memory including writing post it notes and use of phone.  SLP showed pt. an app ([employer]i which has calendars, to do lists, shopping lists, journal to assist with planning/memory/organization.  She also demonstrate decreased anticipatory awareness as she intended on attending her Biology course at [employer] on Jan. 9th.        FIM:  Comprehension Comprehension: 5-Follows basic conversation/direction: With extra time/assistive device Expression Expression Mode: Verbal Expression: 6-Expresses complex ideas: With extra time/assistive device Social Interaction Social Interaction: 6-Interacts appropriately with others with medication or extra time (anti-anxiety, antidepressant). Problem Solving Problem Solving: 5-Solves complex 90% of the time/cues < 10% of the time Memory Memory: 5-Recognizes or recalls 90%  of the time/requires cueing < 10% of the time  Pain Pain Assessment Pain Assessment: No/denies pain Pain Score:   6 Pain Type: Acute pain Pain Location: Rib cage Pain Orientation: Left Pain Descriptors: Aching Pain Onset: With Activity Pain Intervention(s): RN made aware (per pt premedicated before therapy session)  Therapy/Group: Individual Therapy  Royce Macadamia 06/15/2012, 2:31 PM

## 2012-06-15 NOTE — Progress Notes (Signed)
Occupational Therapy Session Note  Patient Details  Name: [name] Woods MRN: [id number] Date of Birth: [birth year]  Today's Date: 06/15/2012 Time: 1130-1200 Time Calculation (min): 30 min   Skilled Therapeutic Interventions/Progress Updates:    1:1 focus on functional transfers and bed mobility on mat, simulating bed height at 29 inches tall. Used a bed sheet as a leg lifter to assist due to pain. Pt required extra time but able to complete.  Therapy Documentation Precautions:  Precautions Precautions: Fall Restrictions Weight Bearing Restrictions: No Pain: Pain Assessment Pain Assessment: No/denies pain  See FIM for current functional status  Therapy/Group: Individual Therapy  Roney Mans [facility] 06/15/2012, 3:29 PM

## 2012-06-16 ENCOUNTER — Inpatient Hospital Stay (HOSPITAL_COMMUNITY): Payer: BC Managed Care – PPO | Admitting: Physical Therapy

## 2012-06-16 ENCOUNTER — Inpatient Hospital Stay (HOSPITAL_COMMUNITY): Payer: BC Managed Care – PPO | Admitting: Speech Pathology

## 2012-06-16 ENCOUNTER — Inpatient Hospital Stay (HOSPITAL_COMMUNITY): Payer: BC Managed Care – PPO

## 2012-06-16 ENCOUNTER — Inpatient Hospital Stay (HOSPITAL_COMMUNITY): Payer: BC Managed Care – PPO | Admitting: *Deleted

## 2012-06-16 NOTE — Progress Notes (Signed)
Social Work Patient ID: [name] Woods, female   DOB: 24-May-1986, 26 y.o.   MRN: [id number]  Met with patient this afternoon to review team conference.  Pt aware and agreeable with targeted d/c for 06/19/12 at mod i goals.  Feels she will have "plenty!" of help at home from her family.  Open to follow with bereavement counseling as well.    Jakeel Starliper, LCSW
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
## 2012-06-16 NOTE — Progress Notes (Signed)
Physical Therapy Session Note  Patient Details  Name: [name] Woods MRN: [id number] Date of Birth: [birth year]  Today's Date: 06/16/2012 Time: 4782-9562 Time Calculation (min): 70 min  Short Term Goals: Week 1:  PT Short Term Goal 1 (Week 1): Pt will perform all bed mobility modified independently. PT Short Term Goal 2 (Week 1): Pt will perform transfers with supervision. PT Short Term Goal 3 (Week 1): Pt will perform gait with LRAD x 150' with supervision. PT Short Term Goal 4 (Week 1): Pt will ascend/descend 5 steps with 2 rails with supervision.  Skilled Therapeutic Interventions/Progress Updates:  Patient propelled WC in controlled environment with supervision 75' for increased endurance.  Sit/stand transfers to/from multiple surfaces to/from RW with SBA.  Sit/supine transfer to mat table with min A to elevate left LE and extended time due to dizziness and pain in ribs with transitional movement.  Re-education with patient on gaze stabilization technique to minimize dizziness during rotational movement.  Patient worked on LE strengthening: ankle pumps x20, quad sets, heel slides, right LE bridging x15, seated LAQ with 5 second hold x15.  Patient required extended rest break between sets due to fatigue.  Patient performed supine/sit transfer with supervision and extended time from flat mat surface.  Standing at [facility] with alternating toe taps, standing on compliant surface with NBOS without UE support, and standing with DLS during functional activities at sink to improve functional balance.  Ambulation with RW in controlled environment for improved endurance 150' x2 bouts with supervision with 1 standing rest break per bout.  Supervision for self-care and toileting transfers with RW.  Session concluded with transfer to [facility] for lunch with family in attendance.     Therapy Documentation Precautions:  Precautions Precautions: Fall Restrictions Weight Bearing Restrictions: No   Pain: Pain  Assessment Pain Assessment: No/denies pain Pain Score: 0-No pain   Locomotion : Ambulation Ambulation/Gait Assistance: 4: Min guard Wheelchair Mobility Distance: 100   See FIM for current functional status  Therapy/Group: Individual Therapy  Rexene Agent 06/16/2012, 12:34 PM

## 2012-06-16 NOTE — Progress Notes (Signed)
Occupational Therapy Session Note  Patient Details  Name: [name] Woods MRN: [id number] Date of Birth: 06-Mar-1986  Today's Date: 06/16/2012 Time: 0800-0855 Time Calculation (min): 55 min  Short Term Goals: Week 1:   STG=LTG secondary to ELOS  Skilled Therapeutic Interventions/Progress Updates:    Pt sitting on toilet upon arrival.  Pt amb to shower without RW to complete bathing task at shower level.  Pt amb with RW to room to complete dressing tasks with sit<>stand from w/c.  Pt required extra time to complete all tasks.  Pt stated she is "a little sore" from therapy the previous day.  Pt exhibited difficulty donning sock on right foot and required assistance.  Pt completed grooming tasks standing at sink before sitting to "fix" hair.  Focus on activity tolerance, standing balance, safety awareness, and compensatory strategies.  Mom present throughout session.  Therapy Documentation Precautions:  Precautions Precautions: Fall Restrictions Weight Bearing Restrictions: No   Pain: Pain Assessment Pain Assessment: 0-10 Pain Score:   5 Pain Type: Acute pain Pain Location: Rib cage Pain Orientation: Left Pain Descriptors: Aching Pain Onset: With Activity Pain Intervention(s): RN made aware (per pt premedicated before therapy session)  See FIM for current functional status  Therapy/Group: Individual Therapy  Rich Brave 06/16/2012, 9:01 AM
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
## 2012-06-16 NOTE — Progress Notes (Signed)
Physical Therapy Session Note  Patient Details  Name: [name] Woods MRN: [id number] Date of Birth: Oct 22, 1985  Today's Date: 06/16/2012 Time: 9:02-9:31 and 13:50-14:45  Time calc: and 55 min Short Term Goals: Week 1:  PT Short Term Goal 1 (Week 1): Pt will perform all bed mobility modified independently. PT Short Term Goal 2 (Week 1): Pt will perform transfers with supervision. PT Short Term Goal 3 (Week 1): Pt will perform gait with LRAD x 150' with supervision. PT Short Term Goal 4 (Week 1): Pt will ascend/descend 5 steps with 2 rails with supervision.  Skilled Therapeutic Interventions/Progress Updates:     AM tx Tx focused on gait training and NMR for balance and LLE activation. Pt with no c/o dizziness today and pain under control.  Sit<>stand with close S and increased time required. Gait training 1x90' with RW and min-guard A for safety. Pt needed one standing break due to fatigue. Subsequent sit<>stands performed wihtout UE assist for LE strengthening and balanced activation.  Performed NMR for LEs in standing step-tap to 6" step:  - RLE step without UE assist x10 - LLE step with UE assist needed x10  Pt performed obstacle course with RW x 50' with step-overs, cone weave, and walking on compliant surface with cues for RW management with min-guard A needed.  Pt propelled WC x90' with S. Pt left in chair in room with mother and all needs in reach.   PM tx Tx focused on family training for in-room mobility with mother, gait and stair training, and therex for LE strengthening. Pt sleeping up on arrival, c/o increased fatigue this afternoon.   Insructed and demonstrated safe practices for all transfers and walking in room and bathroom with RW. Mother return-demonstrated with safe technique and min-guard A. They were reminded to always wear sneakers or anti-slip socks in room and to not get up without mother's assistance. Safety plan updated. Pt able to perform in-room and  bathroom navigation with S only and no safety issues.   Pt ambulated 1x150 and 1x100' with RW and S with decreased speed but no safety issues.   Pt ascended/descended 9 stairs with 1HHA (no rails) step-to pattern with Min A only. Pt needed instruction on pattern, but able to recall later without cues.   Nustep x43min, level 3 with bil LE only for strengthening. Pt fluctuate b/t 35-60spm average due to fatigue.  Pt returned back to bed with speech therapist present for next tx.   Therapy Documentation Precautions:  Precautions Precautions: Fall Restrictions Weight Bearing Restrictions: No    Pain: Pain Assessment Pain Assessment: No/denies pain Pain Score: 0-No pain Pain Type: Acute pain Pain Location: Rib cage Pain Orientation: Left Pain Descriptors: Aching Pain Onset: With Activity Pain Intervention(s): RN made aware (per pt premedicated before therapy session)  See FIM for current functional status  Therapy/Group: Individual Therapy  Clydene Laming, PT, DPT  Eulogio Ditch, Kamarii Buren M 06/16/2012, 9:20 AM
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
## 2012-06-17 DIAGNOSIS — R609 Edema, unspecified: Secondary | ICD-10-CM

## 2012-06-17 DIAGNOSIS — S22009A Unspecified fracture of unspecified thoracic vertebra, initial encounter for closed fracture: Secondary | ICD-10-CM

## 2012-06-17 DIAGNOSIS — S069X9A Unspecified intracranial injury with loss of consciousness of unspecified duration, initial encounter: Secondary | ICD-10-CM

## 2012-06-17 DIAGNOSIS — S2239XA Fracture of one rib, unspecified side, initial encounter for closed fracture: Secondary | ICD-10-CM

## 2012-06-17 DIAGNOSIS — S069XAA Unspecified intracranial injury with loss of consciousness status unknown, initial encounter: Secondary | ICD-10-CM

## 2012-06-17 LAB — CBC
Hemoglobin: 9.6 g/dL — ABNORMAL LOW (ref 12.0–15.0)
MCH: 29.5 pg (ref 26.0–34.0)
MCHC: 33 g/dL (ref 30.0–36.0)
MCV: 89.5 fL (ref 78.0–100.0)
RBC: 3.25 MIL/uL — ABNORMAL LOW (ref 3.87–5.11)
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
## 2012-06-17 NOTE — Progress Notes (Signed)
*  Preliminary Results* Bilateral lower extremity venous duplex completed. Bilateral lower extremities are negative for deep vein thrombosis. There is no evidence of Baker's cyst bilaterally. There is evidence of a small fluid collection of unknown etiology, possibly a hematoma of the right medial mid thigh, and a large fluid collection of unknown etiology, possible hematoma of the left thigh extending from the proximal to distal thigh. The specified area of the left thigh appears to have internal echoes with trace internal flow; etiology is unknown.  06/17/2012 4:21 PM Gertie Fey, RDMS, RDCS

## 2012-06-17 NOTE — Progress Notes (Signed)
Preliminary results of dopplers reported to Dr Riley Kill. New order received. Continue with plan of care.

## 2012-06-18 ENCOUNTER — Encounter (HOSPITAL_COMMUNITY): Payer: BC Managed Care – PPO | Admitting: Occupational Therapy

## 2012-06-18 ENCOUNTER — Inpatient Hospital Stay (HOSPITAL_COMMUNITY): Payer: BC Managed Care – PPO

## 2012-06-18 ENCOUNTER — Inpatient Hospital Stay (HOSPITAL_COMMUNITY): Payer: BC Managed Care – PPO | Admitting: Speech Pathology

## 2012-06-18 ENCOUNTER — Inpatient Hospital Stay (HOSPITAL_COMMUNITY): Payer: BC Managed Care – PPO | Admitting: Physical Therapy

## 2012-06-18 LAB — HEMOGLOBIN AND HEMATOCRIT, BLOOD: Hemoglobin: 9.4 g/dL — ABNORMAL LOW (ref 12.0–15.0)
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
## 2012-06-18 NOTE — Discharge Summary (Signed)
  Discharge summary job # [id number]

## 2012-06-18 NOTE — Progress Notes (Signed)
Occupational Therapy Session Note  Patient Details  Name: [name] Woods MRN: [id number] Date of Birth: 06-18-1985  Today's Date: 06/18/2012 Time: 1000-1045 Time Calculation (min): 45 min   Skilled Therapeutic Interventions/Progress Updates:    D/C ADL completed this AM. Session with focus on ADL performance, functional transfers, energy conservation, safety awareness, standing and sitting balance. No LOB noted. Patient did not require vc's for safety. Pt took rest breaks when appropriate. Pt asked appropriate questions and for assistance when needed. Patient has met all LTGs and is ready for D/C.   Therapy Documentation Precautions:  Precautions Precautions: Fall Restrictions Weight Bearing Restrictions: No Pain: Pain Assessment Pain Assessment: No/denies pain Pain Score: 0-No pain  See FIM for current functional status  Therapy/Group: Individual Therapy  Limmie Patricia, OTR/L 06/18/2012, 11:20 AM

## 2012-06-18 NOTE — Progress Notes (Signed)
Occupational Therapy Note  Patient Details  Name: [name] Woods MRN: [id number] Date of Birth: Mar 17, 1986 Today's Date: 06/18/2012  Time: 1100-1154 Pt denies pain Individual Therapy Pt engaged in kitchen tasks and home mgmt tasks in ADL apartment.  Pt prepared simple snack in kitchen, ambulating without RW but utilizing counter top to provided balance PRN.  Pt demonstrated no unsafe behavior or LOB but recommended to patient to use RW when walking.  Pt amb with RW intp bedroom to retrieve bed covers from floor and make bed up.  Pt able to squat to retrieve items from floor without assistance.  Pt amb with RW to room.  Pt made mod I in room with RW.  RN notified.   Lavone Neri [facility] 06/18/2012, 11:55 AM

## 2012-06-18 NOTE — Progress Notes (Signed)
Physical Therapy Discharge Summary  Patient Details  Name: [name] Woods MRN: [id number] Date of Birth: [birth year]  Today's Date: 06/18/2012 Time: Session #1: 0859-1000, Session #2: 13:02-13:57 Time Calculation (min): Session #1: 61 min, Session #2: 55 min   Patient has met 9 of 10 long term goals due to improved activity tolerance, improved balance, increased strength, increased range of motion and decreased pain.  Patient to discharge at an ambulatory level Modified Independent.   Patient's care partner unavailable to provide the necessary physical assistance at discharge.  Reasons goals not met: Pt has 3 steps to enter her home with no rails and needs min hand held assist to access her home safely.  Goal was set for Supervision.    Recommendation:  Patient will benefit from ongoing skilled PT services in outpatient setting  to continue to advance safe functional mobility, address ongoing impairments in Strength, mobility, balance, gait without assistive device, and minimize fall risk.  Equipment: RW 5" wheels, pt no longer needs WC for home.    Reasons for discharge: treatment goals met and discharge from hospital  Patient/family agrees with progress made and goals achieved: Yes  PT Discharge Precautions/Restrictions Precautions Precautions: Fall Vital Signs   Pain Pain Assessment Pain Assessment: No/denies pain Pain Score: 0-No pain  Vision - History Baseline Vision: No visual deficits Patient Visual Report: No change from baseline Vision - Assessment Eye Alignment: Within Functional Limits Vision Assessment: Vision not tested Perception Perception: Within Functional Limits Praxis Praxis: Intact  Cognition Overall Cognitive Status: Appears within functional limits for tasks assessed Arousal/Alertness: Awake/alert Orientation Level: Oriented X4 Sensation Sensation Light Touch: Impaired Detail Light Touch Impaired Details: Impaired LLE Hot/Cold: Appears  Intact Proprioception: Appears Intact Additional Comments: left thigh numbnees per pt due to the hematoma.   Coordination Gross Motor Movements are Fluid and Coordinated: Yes Fine Motor Movements are Fluid and Coordinated: Yes Motor     Mobility Bed Mobility Supine to Sit: 6: Modified independent (Device/Increase time);HOB flat Sit to Sidelying Right: 6: Modified independent (Device/Increase time);HOB flat Transfers Sit to Stand: 6: Modified independent (Device/Increase time);With upper extremity assist Stand to Sit: 6: Modified independent (Device/Increase time);With upper extremity assist;To chair/3-in-1 Locomotion  Ambulation Ambulation/Gait Assistance: 6: Modified independent (Device/Increase time) Ambulation Distance (Feet): 300 Feet Assistive device: Rolling walker Ambulation/Gait Assistance Details: step through pattern, slow gait speed less than 1.8 ft/sec indicating risk for recurrent falls.  Stairs / Additional Locomotion Stairs: Yes Stairs Assistance: 4: Min assist Stairs Assistance Details (indicate cue type and reason): min assist for balance and stability due to pt reporting no handrails at home, educated re: positioning of person helping her for safest guarding.   Stair Management Technique: Forwards;Step to pattern;No rails;Other (comment) (hand held assist) Number of Stairs: 3  Height of Stairs: 6  Wheelchair Mobility Wheelchair Mobility: Yes Wheelchair Assistance: 6: Modified independent (Device/Increase time) Wheelchair Propulsion: Both upper extremities;Both lower extermities;Other (comment) (alternating uppers and lowers) Wheelchair Parts Management: Independent Distance: 250   Static Sitting Balance Static Sitting - Level of Assistance: 7: Independent Extremity Assessment  RUE Assessment RUE Assessment: Within Functional Limits LUE Assessment LUE Assessment: Within Functional Limits RLE Assessment RLE Assessment: Within Functional Limits LLE  Assessment LLE Assessment: Within Functional Limits  Skilled Interventions: Session #1: this session focused on gait with RW >150' in both household and controlled environment mod I, slow gait speed (<1.8 ft/sec) putting her at risk for recurrent falls.  WC mobility indoor controlled environment and household simulation mod I.  Transfers mod  I into and out of WC.  Bed mobility mod I both into and out of bed in ADL apartment, extra time needed due to pain with transitions.  Dynamic sitting and standing balance during functional task of adjusting clothes and navigating a kitchen environment both mod I.  Furniture and car transfers both mod I with RW from different height surfaces and chairs with and without armrests.  Stairs (pt reports 3 STE home without handrails) min hand held assist for balance up and down 3, 6" steps.  All education completed with pt today, Mom was not present.    Session #2:  Gait with RW >150' from room to gym mod I.  Pt reports neck is sore, applied MHP to neck while resting between exercises.  Standing exercises at parallel bar: squats, hip abduction, marches, heel raises, standing knee flexion, hip extension, (seated rest break about every 3 exercises), balance poses: tandem, semi tandem, SLS, feet together (eyes open and eyes closed for this one) multiple trials all poses eyes open.  LAQs x 10 bil 2# ankle weights, seated heel and toe raises, adduction against ball, seated marches 2#, WC mobility >150' mod I using bil arms or bil legs depending on which was fatigued over flat tiled surfaces.      See FIM for current functional status  [name] Woods, PT, DPT [phone]   06/18/2012, 12:20 PM
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
## 2012-06-18 NOTE — Progress Notes (Signed)
Speech Language Pathology Session Note & Discharge Summary  Patient Details  Name: [name] Woods MRN: [id number] Date of Birth: [birth year]  Today's Date: 06/18/2012 Time: 0800-0840 Time Calculation (min): 40 min  Skilled Therapeutic Intervention: Treatment focus on pt education in regards to current cognitive function and strategies to utilize at home to increase functional problem solving, working memory and overall functional independence. Handouts given. Both the pt and her family (aunt & cousin) verbalized understanding of all the information presented.   Patient has met 2 of 2 long term goals.  Patient to discharge at overall Supervision;Modified Independent level.   Reasons goals not met: N/A   Clinical Impression/Discharge Summary: Pt has made functional gains in the areas of problem solving, awareness and working memory and has met all LTG's this admission. Currently, pt is demonstrating behaviors consistent with a Rancho Level VIII and is overall Mod I-Supervision for working memory with utilization of compensatory strategies, anticipatory awareness and complex problem solving. Education complete and handouts given. Pt will discharge home with 24 hour assistance from family and would benefit from f/u outpatient skilled SLP intervention to maximize cognitive recovery and functional independence.   Care Partner:  Caregiver Able to Provide Assistance: Yes  Type of Caregiver Assistance: Physical;Cognitive  Recommendation:  24 hour supervision/assistance;Outpatient SLP  Rationale for SLP Follow Up: Maximize cognitive function and independence   Equipment: N/A   Reasons for discharge: Treatment goals met;Discharged from hospital   Patient/Family Agrees with Progress Made and Goals Achieved: Yes   See FIM for current functional status  [name] 06/18/2012, 3:32 PM

## 2012-06-18 NOTE — Progress Notes (Signed)
Occupational Therapy Discharge Summary  Patient Details  Name: [name] Woods MRN: [id number] Date of Birth: 1985-07-23  Today's Date: 06/18/2012  Patient has met 12 of 12 long term goals due to improved activity tolerance, improved balance, ability to compensate for deficits, functional use of  LEFT upper and LEFT lower extremity, improved attention and improved awareness.  Pt has made good progress with bathing, dressing, toilet transfers and toileting, shower transfers, simple kitchen tasks and home management tasks.  Pt is mod I/I for all BADLs and home mgmt tasks.  Pt's mother has been present for multiple therapy sessions. Patient to discharge at overall Modified Independent level.  Patient's care partner is independent to provide the necessary physical and cognitive assistance at discharge.    Recommendation:  Patient doesn't need OT f/u at this time  Equipment: No equipment provided  Reasons for discharge: treatment goals met and discharge from hospital  Patient/family agrees with progress made and goals achieved: Yes  OT Discharge Precautions/Restrictions  Precautions Precautions: Fall   Pain Pain Assessment Pain Assessment: No/denies pain Pain Score: 0-No pain ADL ADL Eating: Independent Where Assessed-Eating: Edge of bed Grooming: Independent Where Assessed-Grooming: Sitting at sink;Standing at sink Upper Body Bathing: Independent Where Assessed-Upper Body Bathing: Shower Lower Body Bathing: Modified independent Where Assessed-Lower Body Bathing: Shower Upper Body Dressing: Independent Where Assessed-Upper Body Dressing: Edge of bed Lower Body Dressing: Modified independent Where Assessed-Lower Body Dressing: Edge of bed Toileting: Modified independent Where Assessed-Toileting: Teacher, adult education: Engineer, agricultural Method: Proofreader: Acupuncturist: Materials engineer Method: Designer, industrial/product: Information systems manager with back Vision/Perception  Vision - History Baseline Vision: No visual deficits Patient Visual Report: No change from baseline Vision - Assessment Eye Alignment: Within Functional Limits Vision Assessment: Vision not tested Perception Perception: Within Functional Limits Praxis Praxis: Intact  Cognition Overall Cognitive Status: Appears within functional limits for tasks assessed Arousal/Alertness: Awake/alert Orientation Level: Oriented X4 Sensation Sensation Light Touch: Impaired Detail Light Touch Impaired Details: Impaired LLE Hot/Cold: Appears Intact Proprioception: Appears Intact Additional Comments: left thigh numbnees per pt due to the hematoma.   Coordination Gross Motor Movements are Fluid and Coordinated: Yes Fine Motor Movements are Fluid and Coordinated: Yes Mobility  Bed Mobility Supine to Sit: 6: Modified independent (Device/Increase time);HOB flat Sit to Sidelying Right: 6: Modified independent (Device/Increase time);HOB flat Transfers Sit to Stand: 6: Modified independent (Device/Increase time);With upper extremity assist Stand to Sit: 6: Modified independent (Device/Increase time);With upper extremity assist;To chair/3-in-1  Trunk/Postural Assessment  Cervical Assessment Cervical Assessment: Within Functional Limits Thoracic Assessment Thoracic Assessment: Within Functional Limits Lumbar Assessment Lumbar Assessment: Within Functional Limits Postural Control Postural Control: Within Functional Limits  Balance Static Sitting Balance Static Sitting - Level of Assistance: 7: Independent Extremity/Trunk Assessment RUE Assessment RUE Assessment: Within Functional Limits LUE Assessment LUE Assessment: Within Functional Limits  See FIM for current functional status  Rich Brave 06/18/2012, 12:13 PM

## 2012-06-18 NOTE — Plan of Care (Signed)
Problem: RH Stairs Goal: LTG Patient will ambulate up and down stairs w/assist (PT) LTG: Patient will ambulate up and down # of stairs with assistance (PT)  Outcome: Not Met (add Reason) Pt does not have rails for 3 STE home.  She needs min hand held assist to manage the stairs safely for balance.

## 2012-06-19 DIAGNOSIS — S069XAA Unspecified intracranial injury with loss of consciousness status unknown, initial encounter: Secondary | ICD-10-CM

## 2012-06-19 DIAGNOSIS — S22009A Unspecified fracture of unspecified thoracic vertebra, initial encounter for closed fracture: Secondary | ICD-10-CM

## 2012-06-19 DIAGNOSIS — S2239XA Fracture of one rib, unspecified side, initial encounter for closed fracture: Secondary | ICD-10-CM

## 2012-06-19 DIAGNOSIS — S069X9A Unspecified intracranial injury with loss of consciousness of unspecified duration, initial encounter: Secondary | ICD-10-CM

## 2012-06-19 LAB — CBC
Hemoglobin: 10.1 g/dL — ABNORMAL LOW (ref 12.0–15.0)
MCH: 29.4 pg (ref 26.0–34.0)
MCHC: 32.7 g/dL (ref 30.0–36.0)
MCV: 90.1 fL (ref 78.0–100.0)
RBC: 3.43 MIL/uL — ABNORMAL LOW (ref 3.87–5.11)

## 2012-06-19 MED ORDER — METHOCARBAMOL 500 MG PO TABS: 500.0000 mg | ORAL_TABLET | Freq: Four times a day (QID) | ORAL | Status: DC | PRN

## 2012-06-19 MED ORDER — TRAMADOL HCL 50 MG PO TABS: 50.0000 mg | ORAL_TABLET | Freq: Four times a day (QID) | ORAL | Status: DC | PRN

## 2012-06-19 MED ORDER — ALPRAZOLAM 0.25 MG PO TABS: 0.2500 mg | ORAL_TABLET | Freq: Three times a day (TID) | ORAL | Status: DC | PRN

## 2012-06-19 MED ORDER — ACETAMINOPHEN 325 MG PO TABS: 325.0000 mg | ORAL_TABLET | ORAL | Status: DC | PRN

## 2012-06-19 NOTE — Progress Notes (Signed)
Discharge Note: Pt is alert and oriented, VS are stable, denies CP.  Discharge instructions reviewed with patient by PA-Dan, pt verbalizes understanding.  Wheelchair transportation provided, all belongings with patient.
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
## 2012-06-19 NOTE — Progress Notes (Signed)
Social Work  Discharge Note  The overall goal for the admission was met for:   Discharge location: Yes - home with family to assist  Length of Stay: Yes - 7 days  Discharge activity level: Yes - modified independent  Home/community participation: Yes  Services provided included: MD, RD, PT, OT, SLP, RN, TR, Pharmacy, Neuropsych and SW  Financial Services: Other: self pay  Follow-up services arranged: Outpatient: PT via [facility], DME: rolling walker via [facility] and Patient/Family has no preference for HH/DME agencies  Comments (or additional information):  Patient/Family verbalized understanding of follow-up arrangements: Yes  Individual responsible for coordination of the follow-up plan: patient   Confirmed correct DME delivered: Keilen Kahl 06/19/2012    Anabel Lykins

## 2012-06-22 ENCOUNTER — Encounter: Payer: Self-pay | Admitting: Physician Assistant

## 2012-07-15 ENCOUNTER — Encounter
Payer: BC Managed Care – PPO | Attending: Physical Medicine & Rehabilitation | Admitting: Physical Medicine & Rehabilitation

## 2012-07-15 ENCOUNTER — Encounter: Payer: Self-pay | Admitting: Physical Medicine & Rehabilitation

## 2012-07-15 VITALS — BP 119/58 | HR 70 | Resp 14 | Ht 64.0 in | Wt 196.0 lb

## 2012-07-15 DIAGNOSIS — S0636AA Traumatic hemorrhage of cerebrum, unspecified, with loss of consciousness status unknown, initial encounter: Secondary | ICD-10-CM

## 2012-07-15 DIAGNOSIS — M542 Cervicalgia: Secondary | ICD-10-CM | POA: Insufficient documentation

## 2012-07-15 DIAGNOSIS — IMO0002 Reserved for concepts with insufficient information to code with codable children: Secondary | ICD-10-CM

## 2012-07-15 DIAGNOSIS — S2243XA Multiple fractures of ribs, bilateral, initial encounter for closed fracture: Secondary | ICD-10-CM

## 2012-07-15 DIAGNOSIS — S2249XA Multiple fractures of ribs, unspecified side, initial encounter for closed fracture: Secondary | ICD-10-CM

## 2012-07-15 DIAGNOSIS — X58XXXS Exposure to other specified factors, sequela: Secondary | ICD-10-CM | POA: Insufficient documentation

## 2012-07-15 DIAGNOSIS — S32009A Unspecified fracture of unspecified lumbar vertebra, initial encounter for closed fracture: Secondary | ICD-10-CM

## 2012-07-15 DIAGNOSIS — S272XXA Traumatic hemopneumothorax, initial encounter: Secondary | ICD-10-CM

## 2012-07-15 DIAGNOSIS — S069X9S Unspecified intracranial injury with loss of consciousness of unspecified duration, sequela: Secondary | ICD-10-CM | POA: Insufficient documentation

## 2012-07-15 DIAGNOSIS — S069XAS Unspecified intracranial injury with loss of consciousness status unknown, sequela: Secondary | ICD-10-CM | POA: Insufficient documentation

## 2012-07-15 DIAGNOSIS — S06369A Traumatic hemorrhage of cerebrum, unspecified, with loss of consciousness of unspecified duration, initial encounter: Secondary | ICD-10-CM

## 2012-07-15 NOTE — Progress Notes (Signed)
Subjective:    Patient ID: [name] Woods, female    DOB: 18-Mar-1986, 27 y.o.   MRN: [id number]  HPI  [name] Woods is back regarding her TBI and polytrauma. Her pain is decreasing substantially. She is independent with all functions. She does report mild neck pain.  She is not taking any pain medication.  She is not using any adaptive equipment. Bowels and bladder are functioning. Sleep is good. From an emotional standpoint she's handling things well. Sometimes she becomes emotional.  From a cognitive standpoint she's back to baseline. She reports no attention, memory, arousal, or processing delays.   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 She has been there off and on for 3 years, most recently since October, 2013.  Her employer has been supportive of her during her injury.    Pain Inventory Average Pain 0 Pain Right Now 0 My pain is n/a  In the last 24 hours, has pain interfered with the following? General activity 0 Relation with others 0 Enjoyment of life 0 What TIME of day is your pain at its worst? n/a Sleep (in general) Fair  Pain is worse with: n/a Pain improves with: n/a Relief from Meds: n/a  Mobility ability to climb steps?  yes do you drive?  yes  Function employed # of hrs/week 40-teacher  Neuro/Psych No problems in this area  Prior Studies Any changes since last visit?  no  Physicians involved in your care Any changes since last visit?  no   History reviewed. No pertinent family history. History   Social History  . Marital Status: Single    Spouse Name: N/A    Number of Children: N/A  . Years of Education: N/A   Social History Main Topics  . Smoking status: Never Smoker   . Smokeless tobacco: None  . Alcohol Use: No  . Drug Use: No  . Sexually Active: None   Other Topics Concern  . None   Social History Narrative  . None   Past Surgical History  Procedure Date  . Wisdom tooth extraction    History reviewed. No  pertinent past medical history. BP 119/58  Pulse 70  Resp 14  Ht 5\' 4"  (1.626 m)  Wt 196 lb (88.905 kg)  BMI 33.64 kg/m2  SpO2 99%  LMP 05/24/2012     Review of Systems  All other systems reviewed and are negative.       Objective:   Physical Exam  General: Alert and oriented x 3, No apparent distress HEENT: Head is normocephalic, atraumatic, PERRLA, EOMI, sclera anicteric, oral mucosa pink and moist, dentition intact, ext ear canals clear,  Neck: Supple without JVD or lymphadenopathy Heart: Reg rate and rhythm. No murmurs rubs or gallops Chest: CTA bilaterally without wheezes, rales, or rhonchi; no distress Abdomen: Soft, non-tender, non-distended, bowel sounds positive. Extremities: No clubbing, cyanosis, or edema. Pulses are 2+ Skin: Clean and intact without signs of breakdown Neuro: Pt is cognitively appropriate with normal insight and awareness. Remembered 2/3 words after 5 minutes. Intact serial 7's with extra time. Able to sequence words and numbers for me. Good attention and focus. Socially appropriate.. Cranial nerves 2-12 are intact. Sensory exam is normal. Reflexes are 2+ in all 4's. Fine motor coordination is intact. No tremors. Motor function is grossly 5/5.  Musculoskeletal: Full ROM, No pain with AROM or PROM in the trunk, or extremities. Posture appropriate. Mild pain right low neck without spasm. Worsened slightly with neck  extension. Gait nearly normal with only minimal antalgia over the left leg. Psych: Pt's affect is appropriate. Pt is cooperative         Assessment & Plan:  1. Polytrauma with traumatic brain injury/hemorrhagic contusion right frontal lobe, multiple left-sided of fractures and transverse process fractures    Plan: 1. The patient has improved immensely. I gave her clearance to return to work on a sedentary level, part time for the first 2 weeks and then full time as needed. 2. She may stop PT and work on a HEP. Discussed appropriate  posture and cervical rom exercises. 3. I will see her back prn. 30 minutes of face to face patient care time were spent during this visit. All questions were encouraged and answered.

## 2012-07-15 NOTE — Patient Instructions (Signed)
Continue activity as tolerated.  

## 2012-07-18 ENCOUNTER — Encounter: Payer: Self-pay | Admitting: Physician Assistant

## 2013-02-21 ENCOUNTER — Inpatient Hospital Stay: Payer: Self-pay

## 2013-02-21 LAB — CBC WITH DIFFERENTIAL/PLATELET
Eosinophil #: 0.1 10*3/uL (ref 0.0–0.7)
Eosinophil %: 1.5 %
HGB: 11.9 g/dL — ABNORMAL LOW (ref 12.0–16.0)
Lymphocyte #: 1.8 10*3/uL (ref 1.0–3.6)
Monocyte %: 8.5 %
RDW: 12.6 % (ref 11.5–14.5)
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
## 2014-12-13 DIAGNOSIS — E669 Obesity, unspecified: Secondary | ICD-10-CM | POA: Insufficient documentation

## 2015-10-31 ENCOUNTER — Encounter: Payer: Self-pay | Admitting: Emergency Medicine

## 2015-10-31 ENCOUNTER — Emergency Department
Admission: EM | Admit: 2015-10-31 | Discharge: 2015-10-31 | Disposition: A | Discharge status: Home / Self Care | Payer: BLUE CROSS/BLUE SHIELD | Attending: Emergency Medicine | Admitting: Emergency Medicine

## 2015-10-31 DIAGNOSIS — R202 Paresthesia of skin: Secondary | ICD-10-CM | POA: Diagnosis present

## 2015-10-31 DIAGNOSIS — R200 Anesthesia of skin: Secondary | ICD-10-CM

## 2015-10-31 LAB — CBC WITH DIFFERENTIAL/PLATELET
Basophils Absolute: 0 10*3/uL (ref 0–0.1)
Basophils Relative: 1 %
Eosinophils Absolute: 0.4 10*3/uL (ref 0–0.7)
HCT: 37.3 % (ref 35.0–47.0)
HEMOGLOBIN: 12.6 g/dL (ref 12.0–16.0)
LYMPHS ABS: 1.9 10*3/uL (ref 1.0–3.6)
MCH: 28.9 pg (ref 26.0–34.0)
MCHC: 33.8 g/dL (ref 32.0–36.0)
MCV: 85.4 fL (ref 80.0–100.0)
Monocytes Absolute: 0.3 10*3/uL (ref 0.2–0.9)
Neutro Abs: 1.4 10*3/uL (ref 1.4–6.5)
Platelets: 196 10*3/uL (ref 150–440)
RBC: 4.36 MIL/uL (ref 3.80–5.20)
RDW: 12.9 % (ref 11.5–14.5)
WBC: 4 10*3/uL (ref 3.6–11.0)

## 2015-10-31 LAB — BASIC METABOLIC PANEL
Anion gap: 5 (ref 5–15)
BUN: 12 mg/dL (ref 6–20)
CHLORIDE: 107 mmol/L (ref 101–111)
CO2: 25 mmol/L (ref 22–32)
Calcium: 9 mg/dL (ref 8.9–10.3)
Creatinine, Ser: 0.91 mg/dL (ref 0.44–1.00)
GFR calc Af Amer: >60 mL/min (ref >60)
GFR calc non Af Amer: >60 mL/min (ref >60)
GLUCOSE: 90 mg/dL (ref 65–99)
POTASSIUM: 4.4 mmol/L (ref 3.5–5.1)
Sodium: 137 mmol/L (ref 135–145)

## 2015-10-31 LAB — POCT PREGNANCY, URINE: Preg Test, Ur: NEGATIVE

## 2015-10-31 NOTE — ED Notes (Signed)
Pt to ed with c/o numbness in feet and ankles that started on Saturday.  Pt states today much worse and reports numbness has moved up to bilat knees.

## 2015-10-31 NOTE — Discharge Instructions (Signed)
You were evaluated for complaint of bilateral foot and leg numbness which is improving. Although no certain cause was found, and your exam and evaluation are reassuring in the emergency department today.  You do certainly need close follow-up, and I recommend you see your doctor within 2 days. I'm also suggesting neurology evaluation, and you may call Dr. Alver FisherShaw's office and make a next available appointment.  Return to emergency room for any worsening condition including any new or worsening neurologic symptoms, weakness, numbness, slurred speech, back pain, fever, confusion or altered mental status, skin rash, or any other symptoms concerning to you.
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
## 2015-12-12 ENCOUNTER — Other Ambulatory Visit: Payer: Self-pay | Admitting: Neurology

## 2015-12-12 DIAGNOSIS — M542 Cervicalgia: Secondary | ICD-10-CM

## 2015-12-12 DIAGNOSIS — R202 Paresthesia of skin: Principal | ICD-10-CM

## 2015-12-12 DIAGNOSIS — G5793 Unspecified mononeuropathy of bilateral lower limbs: Secondary | ICD-10-CM

## 2015-12-12 DIAGNOSIS — R200 Anesthesia of skin: Secondary | ICD-10-CM

## 2015-12-28 ENCOUNTER — Ambulatory Visit: Admission: RE | Admit: 2015-12-28 | Payer: BLUE CROSS/BLUE SHIELD | Source: Ambulatory Visit

## 2015-12-28 ENCOUNTER — Ambulatory Visit: Payer: BLUE CROSS/BLUE SHIELD

## 2016-01-01 ENCOUNTER — Ambulatory Visit: Payer: BLUE CROSS/BLUE SHIELD

## 2016-01-16 ENCOUNTER — Ambulatory Visit: Payer: BLUE CROSS/BLUE SHIELD

## 2016-04-11 LAB — OB RESULTS CONSOLE HEPATITIS B SURFACE ANTIGEN: Hepatitis B Surface Ag: NEGATIVE

## 2016-04-11 LAB — OB RESULTS CONSOLE RPR: RPR: NONREACTIVE

## 2016-04-11 LAB — SICKLE CELL SCREEN: SICKLE CELL SCREEN: NORMAL

## 2016-04-11 LAB — OB RESULTS CONSOLE RUBELLA ANTIBODY, IGM: Rubella: IMMUNE

## 2016-04-11 LAB — OB RESULTS CONSOLE PLATELET COUNT: Platelets: 236 10*3/uL

## 2016-04-11 LAB — OB RESULTS CONSOLE ABO/RH: RH Type: POSITIVE

## 2016-04-11 LAB — HM PAP SMEAR

## 2016-04-11 LAB — OB RESULTS CONSOLE HGB/HCT, BLOOD
HEMATOCRIT: 35 %
HEMOGLOBIN: 11.3 g/dL

## 2016-04-11 LAB — OB RESULTS CONSOLE GC/CHLAMYDIA
Chlamydia: NEGATIVE
GC PROBE AMP, GENITAL: NEGATIVE

## 2016-04-11 LAB — OB RESULTS CONSOLE VARICELLA ZOSTER ANTIBODY, IGG: Varicella: IMMUNE

## 2016-04-11 LAB — OB RESULTS CONSOLE HIV ANTIBODY (ROUTINE TESTING): HIV: NONREACTIVE
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
## 2016-08-14 ENCOUNTER — Other Ambulatory Visit: Payer: Self-pay | Admitting: Advanced Practice Midwife

## 2016-08-14 DIAGNOSIS — IMO0002 Reserved for concepts with insufficient information to code with codable children: Secondary | ICD-10-CM

## 2016-08-14 DIAGNOSIS — Z0489 Encounter for examination and observation for other specified reasons: Secondary | ICD-10-CM

## 2016-08-15 NOTE — Progress Notes (Signed)
This is requesting a sign an order. However, I have no idea how to sign this.  Let me know, if there is something else I need to actually do.  Thanks.

## 2016-08-20 ENCOUNTER — Ambulatory Visit (INDEPENDENT_AMBULATORY_CARE_PROVIDER_SITE_OTHER): Payer: BLUE CROSS/BLUE SHIELD

## 2016-08-20 ENCOUNTER — Ambulatory Visit (INDEPENDENT_AMBULATORY_CARE_PROVIDER_SITE_OTHER): Payer: BLUE CROSS/BLUE SHIELD | Admitting: Obstetrics & Gynecology

## 2016-08-20 ENCOUNTER — Other Ambulatory Visit: Payer: BLUE CROSS/BLUE SHIELD

## 2016-08-20 VITALS — BP 110/70 | HR 95 | Wt 233.0 lb

## 2016-08-20 DIAGNOSIS — Z3A28 28 weeks gestation of pregnancy: Secondary | ICD-10-CM

## 2016-08-20 DIAGNOSIS — Z3490 Encounter for supervision of normal pregnancy, unspecified, unspecified trimester: Secondary | ICD-10-CM

## 2016-08-20 DIAGNOSIS — O0993 Supervision of high risk pregnancy, unspecified, third trimester: Secondary | ICD-10-CM | POA: Insufficient documentation

## 2016-08-20 DIAGNOSIS — Z0489 Encounter for examination and observation for other specified reasons: Secondary | ICD-10-CM

## 2016-08-20 DIAGNOSIS — Z113 Encounter for screening for infections with a predominantly sexual mode of transmission: Secondary | ICD-10-CM

## 2016-08-20 DIAGNOSIS — Z362 Encounter for other antenatal screening follow-up: Secondary | ICD-10-CM

## 2016-08-20 DIAGNOSIS — Z131 Encounter for screening for diabetes mellitus: Secondary | ICD-10-CM

## 2016-08-20 DIAGNOSIS — IMO0002 Reserved for concepts with insufficient information to code with codable children: Secondary | ICD-10-CM

## 2016-08-20 NOTE — Progress Notes (Signed)
US today, discussed. PNV, FMC, IUD Paraguard discussed vs BTL (no hormones desired). Glucola today.

## 2016-08-21 ENCOUNTER — Encounter: Payer: Self-pay | Admitting: Advanced Practice Midwife

## 2016-08-21 LAB — 28 WEEK RH+PANEL
BASOS ABS: 0 10*3/uL (ref 0.0–0.2)
Basos: 0 %
EOS (ABSOLUTE): 0.2 10*3/uL (ref 0.0–0.4)
EOS: 4 %
Gestational Diabetes Screen: 82 mg/dL (ref 65–139)
HEMATOCRIT: 32.6 % — AB (ref 34.0–46.6)
HIV SCREEN 4TH GENERATION: NONREACTIVE
Hemoglobin: 10.7 g/dL — ABNORMAL LOW (ref 11.1–15.9)
IMMATURE GRANULOCYTES: 0 %
Immature Grans (Abs): 0 10*3/uL (ref 0.0–0.1)
LYMPHS ABS: 1.2 10*3/uL (ref 0.7–3.1)
Lymphs: 21 %
MCH: 29.9 pg (ref 26.6–33.0)
MCHC: 32.8 g/dL (ref 31.5–35.7)
MCV: 91 fL (ref 79–97)
MONOCYTES: 7 %
Monocytes Absolute: 0.4 10*3/uL (ref 0.1–0.9)
Neutrophils Absolute: 3.9 10*3/uL (ref 1.4–7.0)
Neutrophils: 68 %
PLATELETS: 235 10*3/uL (ref 150–379)
RBC: 3.58 x10E6/uL — ABNORMAL LOW (ref 3.77–5.28)
RDW: 12.9 % (ref 12.3–15.4)
RPR Ser Ql: NONREACTIVE
WBC: 5.8 10*3/uL (ref 3.4–10.8)

## 2016-09-03 ENCOUNTER — Encounter: Payer: Self-pay | Admitting: Advanced Practice Midwife

## 2016-09-03 ENCOUNTER — Ambulatory Visit (INDEPENDENT_AMBULATORY_CARE_PROVIDER_SITE_OTHER): Payer: BLUE CROSS/BLUE SHIELD | Admitting: Advanced Practice Midwife

## 2016-09-03 VITALS — BP 118/70 | Wt 234.0 lb

## 2016-09-03 DIAGNOSIS — Z3A30 30 weeks gestation of pregnancy: Secondary | ICD-10-CM

## 2016-09-03 LAB — GLUCOSE, 1 HOUR GESTATIONAL: Glucose 1 Hour: 67

## 2016-09-03 NOTE — Patient Instructions (Signed)
Third Trimester of Pregnancy The third trimester is from week 28 through week 40 (months 7 through 9). The third trimester is a time when the unborn baby (fetus) is growing rapidly. At the end of the ninth month, the fetus is about 20 inches in length and weighs 6-10 pounds. Body changes during your third trimester Your body will continue to go through many changes during pregnancy. The changes vary from woman to woman. During the third trimester:  Your weight will continue to increase. You can expect to gain 25-35 pounds (11-16 kg) by the end of the pregnancy.  You may begin to get stretch marks on your hips, abdomen, and breasts.  You may urinate more often because the fetus is moving lower into your pelvis and pressing on your bladder.  You may develop or continue to have heartburn. This is caused by increased hormones that slow down muscles in the digestive tract.  You may develop or continue to have constipation because increased hormones slow digestion and cause the muscles that push waste through your intestines to relax.  You may develop hemorrhoids. These are swollen veins (varicose veins) in the rectum that can itch or be painful.  You may develop swollen, bulging veins (varicose veins) in your legs.  You may have increased body aches in the pelvis, back, or thighs. This is due to weight gain and increased hormones that are relaxing your joints.  You may have changes in your hair. These can include thickening of your hair, rapid growth, and changes in texture. Some women also have hair loss during or after pregnancy, or hair that feels dry or thin. Your hair will most likely return to normal after your baby is born.  Your breasts will continue to grow and they will continue to become tender. A yellow fluid (colostrum) may leak from your breasts. This is the first milk you are producing for your baby.  Your belly button may stick out.  You may notice more swelling in your hands,  face, or ankles.  You may have increased tingling or numbness in your hands, arms, and legs. The skin on your belly may also feel numb.  You may feel short of breath because of your expanding uterus.  You may have more problems sleeping. This can be caused by the size of your belly, increased need to urinate, and an increase in your body's metabolism.  You may notice the fetus "dropping," or moving lower in your abdomen (lightening).  You may have increased vaginal discharge.  You may notice your joints feel loose and you may have pain around your pelvic bone.  What to expect at prenatal visits You will have prenatal exams every 2 weeks until week 36. Then you will have weekly prenatal exams. During a routine prenatal visit:  You will be weighed to make sure you and the baby are growing normally.  Your blood pressure will be taken.  Your abdomen will be measured to track your baby's growth.  The fetal heartbeat will be listened to.  Any test results from the previous visit will be discussed.  You may have a cervical check near your due date to see if your cervix has softened or thinned (effaced).  You will be tested for Group B streptococcus. This happens between 35 and 37 weeks.  Your health care provider may ask you:  What your birth plan is.  How you are feeling.  If you are feeling the baby move.  If you have had   any abnormal symptoms, such as leaking fluid, bleeding, severe headaches, or abdominal cramping.  If you are using any tobacco products, including cigarettes, chewing tobacco, and electronic cigarettes.  If you have any questions.  Other tests or screenings that may be performed during your third trimester include:  Blood tests that check for low iron levels (anemia).  Fetal testing to check the health, activity level, and growth of the fetus. Testing is done if you have certain medical conditions or if there are problems during the  pregnancy.  Nonstress test (NST). This test checks the health of your baby to make sure there are no signs of problems, such as the baby not getting enough oxygen. During this test, a belt is placed around your belly. The baby is made to move, and its heart rate is monitored during movement.  What is false labor? False labor is a condition in which you feel small, irregular tightenings of the muscles in the womb (contractions) that usually go away with rest, changing position, or drinking water. These are called Braxton Hicks contractions. Contractions may last for hours, days, or even weeks before true labor sets in. If contractions come at regular intervals, become more frequent, increase in intensity, or become painful, you should see your health care provider. What are the signs of labor?  Abdominal cramps.  Regular contractions that start at 10 minutes apart and become stronger and more frequent with time.  Contractions that start on the top of the uterus and spread down to the lower abdomen and back.  Increased pelvic pressure and dull back pain.  A watery or bloody mucus discharge that comes from the vagina.  Leaking of amniotic fluid. This is also known as your "water breaking." It could be a slow trickle or a gush. Let your health care provider know if it has a color or strange odor. If you have any of these signs, call your health care provider right away, even if it is before your due date. Follow these instructions at home: Medicines  Follow your health care provider's instructions regarding medicine use. Specific medicines may be either safe or unsafe to take during pregnancy.  Take a prenatal vitamin that contains at least 600 micrograms (mcg) of folic acid.  If you develop constipation, try taking a stool softener if your health care provider approves. Eating and drinking  Eat a balanced diet that includes fresh fruits and vegetables, whole grains, good sources of protein  such as meat, eggs, or tofu, and low-fat dairy. Your health care provider will help you determine the amount of weight gain that is right for you.  Avoid raw meat and uncooked cheese. These carry germs that can cause birth defects in the baby.  If you have low calcium intake from food, talk to your health care provider about whether you should take a daily calcium supplement.  Eat four or five small meals rather than three large meals a day.  Limit foods that are high in fat and processed sugars, such as fried and sweet foods.  To prevent constipation: ? Drink enough fluid to keep your urine clear or pale yellow. ? Eat foods that are high in fiber, such as fresh fruits and vegetables, whole grains, and beans. Activity  Exercise only as directed by your health care provider. Most women can continue their usual exercise routine during pregnancy. Try to exercise for 30 minutes at least 5 days a week. Stop exercising if you experience uterine contractions.  Avoid heavy   lifting.  Do not exercise in extreme heat or humidity, or at high altitudes.  Wear low-heel, comfortable shoes.  Practice good posture.  You may continue to have sex unless your health care provider tells you otherwise. Relieving pain and discomfort  Take frequent breaks and rest with your legs elevated if you have leg cramps or low back pain.  Take warm sitz baths to soothe any pain or discomfort caused by hemorrhoids. Use hemorrhoid cream if your health care provider approves.  Wear a good support bra to prevent discomfort from breast tenderness.  If you develop varicose veins: ? Wear support pantyhose or compression stockings as told by your healthcare provider. ? Elevate your feet for 15 minutes, 3-4 times a day. Prenatal care  Write down your questions. Take them to your prenatal visits.  Keep all your prenatal visits as told by your health care provider. This is important. Safety  Wear your seat belt at  all times when driving.  Make a list of emergency phone numbers, including numbers for family, friends, the hospital, and police and fire departments. General instructions  Avoid cat litter boxes and soil used by cats. These carry germs that can cause birth defects in the baby. If you have a cat, ask someone to clean the litter box for you.  Do not travel far distances unless it is absolutely necessary and only with the approval of your health care provider.  Do not use hot tubs, steam rooms, or saunas.  Do not drink alcohol.  Do not use any products that contain nicotine or tobacco, such as cigarettes and e-cigarettes. If you need help quitting, ask your health care provider.  Do not use any medicinal herbs or unprescribed drugs. These chemicals affect the formation and growth of the baby.  Do not douche or use tampons or scented sanitary pads.  Do not cross your legs for long periods of time.  To prepare for the arrival of your baby: ? Take prenatal classes to understand, practice, and ask questions about labor and delivery. ? Make a trial run to the hospital. ? Visit the hospital and tour the maternity area. ? Arrange for maternity or paternity leave through employers. ? Arrange for family and friends to take care of pets while you are in the hospital. ? Purchase a rear-facing car seat and make sure you know how to install it in your car. ? Pack your hospital bag. ? Prepare the baby's nursery. Make sure to remove all pillows and stuffed animals from the baby's crib to prevent suffocation.  Visit your dentist if you have not gone during your pregnancy. Use a soft toothbrush to brush your teeth and be gentle when you floss. Contact a health care provider if:  You are unsure if you are in labor or if your water has broken.  You become dizzy.  You have mild pelvic cramps, pelvic pressure, or nagging pain in your abdominal area.  You have lower back pain.  You have persistent  nausea, vomiting, or diarrhea.  You have an unusual or bad smelling vaginal discharge.  You have pain when you urinate. Get help right away if:  Your water breaks before 37 weeks.  You have regular contractions less than 5 minutes apart before 37 weeks.  You have a fever.  You are leaking fluid from your vagina.  You have spotting or bleeding from your vagina.  You have severe abdominal pain or cramping.  You have rapid weight loss or weight gain.    You have shortness of breath with chest pain.  You notice sudden or extreme swelling of your face, hands, ankles, feet, or legs.  Your baby makes fewer than 10 movements in 2 hours.  You have severe headaches that do not go away when you take medicine.  You have vision changes. Summary  The third trimester is from week 28 through week 40, months 7 through 9. The third trimester is a time when the unborn baby (fetus) is growing rapidly.  During the third trimester, your discomfort may increase as you and your baby continue to gain weight. You may have abdominal, leg, and back pain, sleeping problems, and an increased need to urinate.  During the third trimester your breasts will keep growing and they will continue to become tender. A yellow fluid (colostrum) may leak from your breasts. This is the first milk you are producing for your baby.  False labor is a condition in which you feel small, irregular tightenings of the muscles in the womb (contractions) that eventually go away. These are called Braxton Hicks contractions. Contractions may last for hours, days, or even weeks before true labor sets in.  Signs of labor can include: abdominal cramps; regular contractions that start at 10 minutes apart and become stronger and more frequent with time; watery or bloody mucus discharge that comes from the vagina; increased pelvic pressure and dull back pain; and leaking of amniotic fluid. This information is not intended to replace advice  given to you by your health care provider. Make sure you discuss any questions you have with your health care provider. Document Released: 05/28/2001 Document Revised: 11/09/2015 Document Reviewed: 08/04/2012 Elsevier Interactive Patient Education  2017 [employer]  
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
## 2016-09-17 ENCOUNTER — Ambulatory Visit (INDEPENDENT_AMBULATORY_CARE_PROVIDER_SITE_OTHER): Payer: BLUE CROSS/BLUE SHIELD | Admitting: Obstetrics and Gynecology

## 2016-09-17 VITALS — BP 116/60 | Wt 238.0 lb

## 2016-09-17 DIAGNOSIS — O99213 Obesity complicating pregnancy, third trimester: Secondary | ICD-10-CM

## 2016-09-17 DIAGNOSIS — Z3490 Encounter for supervision of normal pregnancy, unspecified, unspecified trimester: Secondary | ICD-10-CM

## 2016-09-17 DIAGNOSIS — Z3A32 32 weeks gestation of pregnancy: Secondary | ICD-10-CM

## 2016-09-17 NOTE — Progress Notes (Signed)
SOB

## 2016-09-17 NOTE — Patient Instructions (Signed)
Third Trimester of Pregnancy The third trimester is from week 28 through week 40 (months 7 through 9). The third trimester is a time when the unborn baby (fetus) is growing rapidly. At the end of the ninth month, the fetus is about 20 inches in length and weighs 6-10 pounds. Body changes during your third trimester Your body will continue to go through many changes during pregnancy. The changes vary from woman to woman. During the third trimester:  Your weight will continue to increase. You can expect to gain 25-35 pounds (11-16 kg) by the end of the pregnancy.  You may begin to get stretch marks on your hips, abdomen, and breasts.  You may urinate more often because the fetus is moving lower into your pelvis and pressing on your bladder.  You may develop or continue to have heartburn. This is caused by increased hormones that slow down muscles in the digestive tract.  You may develop or continue to have constipation because increased hormones slow digestion and cause the muscles that push waste through your intestines to relax.  You may develop hemorrhoids. These are swollen veins (varicose veins) in the rectum that can itch or be painful.  You may develop swollen, bulging veins (varicose veins) in your legs.  You may have increased body aches in the pelvis, back, or thighs. This is due to weight gain and increased hormones that are relaxing your joints.  You may have changes in your hair. These can include thickening of your hair, rapid growth, and changes in texture. Some women also have hair loss during or after pregnancy, or hair that feels dry or thin. Your hair will most likely return to normal after your baby is born.  Your breasts will continue to grow and they will continue to become tender. A yellow fluid (colostrum) may leak from your breasts. This is the first milk you are producing for your baby.  Your belly button may stick out.  You may notice more swelling in your hands,  face, or ankles.  You may have increased tingling or numbness in your hands, arms, and legs. The skin on your belly may also feel numb.  You may feel short of breath because of your expanding uterus.  You may have more problems sleeping. This can be caused by the size of your belly, increased need to urinate, and an increase in your body's metabolism.  You may notice the fetus "dropping," or moving lower in your abdomen (lightening).  You may have increased vaginal discharge.  You may notice your joints feel loose and you may have pain around your pelvic bone.  What to expect at prenatal visits You will have prenatal exams every 2 weeks until week 36. Then you will have weekly prenatal exams. During a routine prenatal visit:  You will be weighed to make sure you and the baby are growing normally.  Your blood pressure will be taken.  Your abdomen will be measured to track your baby's growth.  The fetal heartbeat will be listened to.  Any test results from the previous visit will be discussed.  You may have a cervical check near your due date to see if your cervix has softened or thinned (effaced).  You will be tested for Group B streptococcus. This happens between 35 and 37 weeks.  Your health care provider may ask you:  What your birth plan is.  How you are feeling.  If you are feeling the baby move.  If you have had   any abnormal symptoms, such as leaking fluid, bleeding, severe headaches, or abdominal cramping.  If you are using any tobacco products, including cigarettes, chewing tobacco, and electronic cigarettes.  If you have any questions.  Other tests or screenings that may be performed during your third trimester include:  Blood tests that check for low iron levels (anemia).  Fetal testing to check the health, activity level, and growth of the fetus. Testing is done if you have certain medical conditions or if there are problems during the  pregnancy.  Nonstress test (NST). This test checks the health of your baby to make sure there are no signs of problems, such as the baby not getting enough oxygen. During this test, a belt is placed around your belly. The baby is made to move, and its heart rate is monitored during movement.  What is false labor? False labor is a condition in which you feel small, irregular tightenings of the muscles in the womb (contractions) that usually go away with rest, changing position, or drinking water. These are called Braxton Hicks contractions. Contractions may last for hours, days, or even weeks before true labor sets in. If contractions come at regular intervals, become more frequent, increase in intensity, or become painful, you should see your health care provider. What are the signs of labor?  Abdominal cramps.  Regular contractions that start at 10 minutes apart and become stronger and more frequent with time.  Contractions that start on the top of the uterus and spread down to the lower abdomen and back.  Increased pelvic pressure and dull back pain.  A watery or bloody mucus discharge that comes from the vagina.  Leaking of amniotic fluid. This is also known as your "water breaking." It could be a slow trickle or a gush. Let your health care provider know if it has a color or strange odor. If you have any of these signs, call your health care provider right away, even if it is before your due date. Follow these instructions at home: Medicines  Follow your health care provider's instructions regarding medicine use. Specific medicines may be either safe or unsafe to take during pregnancy.  Take a prenatal vitamin that contains at least 600 micrograms (mcg) of folic acid.  If you develop constipation, try taking a stool softener if your health care provider approves. Eating and drinking  Eat a balanced diet that includes fresh fruits and vegetables, whole grains, good sources of protein  such as meat, eggs, or tofu, and low-fat dairy. Your health care provider will help you determine the amount of weight gain that is right for you.  Avoid raw meat and uncooked cheese. These carry germs that can cause birth defects in the baby.  If you have low calcium intake from food, talk to your health care provider about whether you should take a daily calcium supplement.  Eat four or five small meals rather than three large meals a day.  Limit foods that are high in fat and processed sugars, such as fried and sweet foods.  To prevent constipation: ? Drink enough fluid to keep your urine clear or pale yellow. ? Eat foods that are high in fiber, such as fresh fruits and vegetables, whole grains, and beans. Activity  Exercise only as directed by your health care provider. Most women can continue their usual exercise routine during pregnancy. Try to exercise for 30 minutes at least 5 days a week. Stop exercising if you experience uterine contractions.  Avoid heavy   lifting.  Do not exercise in extreme heat or humidity, or at high altitudes.  Wear low-heel, comfortable shoes.  Practice good posture.  You may continue to have sex unless your health care provider tells you otherwise. Relieving pain and discomfort  Take frequent breaks and rest with your legs elevated if you have leg cramps or low back pain.  Take warm sitz baths to soothe any pain or discomfort caused by hemorrhoids. Use hemorrhoid cream if your health care provider approves.  Wear a good support bra to prevent discomfort from breast tenderness.  If you develop varicose veins: ? Wear support pantyhose or compression stockings as told by your healthcare provider. ? Elevate your feet for 15 minutes, 3-4 times a day. Prenatal care  Write down your questions. Take them to your prenatal visits.  Keep all your prenatal visits as told by your health care provider. This is important. Safety  Wear your seat belt at  all times when driving.  Make a list of emergency phone numbers, including numbers for family, friends, the hospital, and police and fire departments. General instructions  Avoid cat litter boxes and soil used by cats. These carry germs that can cause birth defects in the baby. If you have a cat, ask someone to clean the litter box for you.  Do not travel far distances unless it is absolutely necessary and only with the approval of your health care provider.  Do not use hot tubs, steam rooms, or saunas.  Do not drink alcohol.  Do not use any products that contain nicotine or tobacco, such as cigarettes and e-cigarettes. If you need help quitting, ask your health care provider.  Do not use any medicinal herbs or unprescribed drugs. These chemicals affect the formation and growth of the baby.  Do not douche or use tampons or scented sanitary pads.  Do not cross your legs for long periods of time.  To prepare for the arrival of your baby: ? Take prenatal classes to understand, practice, and ask questions about labor and delivery. ? Make a trial run to the hospital. ? Visit the hospital and tour the maternity area. ? Arrange for maternity or paternity leave through employers. ? Arrange for family and friends to take care of pets while you are in the hospital. ? Purchase a rear-facing car seat and make sure you know how to install it in your car. ? Pack your hospital bag. ? Prepare the baby's nursery. Make sure to remove all pillows and stuffed animals from the baby's crib to prevent suffocation.  Visit your dentist if you have not gone during your pregnancy. Use a soft toothbrush to brush your teeth and be gentle when you floss. Contact a health care provider if:  You are unsure if you are in labor or if your water has broken.  You become dizzy.  You have mild pelvic cramps, pelvic pressure, or nagging pain in your abdominal area.  You have lower back pain.  You have persistent  nausea, vomiting, or diarrhea.  You have an unusual or bad smelling vaginal discharge.  You have pain when you urinate. Get help right away if:  Your water breaks before 37 weeks.  You have regular contractions less than 5 minutes apart before 37 weeks.  You have a fever.  You are leaking fluid from your vagina.  You have spotting or bleeding from your vagina.  You have severe abdominal pain or cramping.  You have rapid weight loss or weight gain.    You have shortness of breath with chest pain.  You notice sudden or extreme swelling of your face, hands, ankles, feet, or legs.  Your baby makes fewer than 10 movements in 2 hours.  You have severe headaches that do not go away when you take medicine.  You have vision changes. Summary  The third trimester is from week 28 through week 40, months 7 through 9. The third trimester is a time when the unborn baby (fetus) is growing rapidly.  During the third trimester, your discomfort may increase as you and your baby continue to gain weight. You may have abdominal, leg, and back pain, sleeping problems, and an increased need to urinate.  During the third trimester your breasts will keep growing and they will continue to become tender. A yellow fluid (colostrum) may leak from your breasts. This is the first milk you are producing for your baby.  False labor is a condition in which you feel small, irregular tightenings of the muscles in the womb (contractions) that eventually go away. These are called Braxton Hicks contractions. Contractions may last for hours, days, or even weeks before true labor sets in.  Signs of labor can include: abdominal cramps; regular contractions that start at 10 minutes apart and become stronger and more frequent with time; watery or bloody mucus discharge that comes from the vagina; increased pelvic pressure and dull back pain; and leaking of amniotic fluid. This information is not intended to replace advice  given to you by your health care provider. Make sure you discuss any questions you have with your health care provider. Document Released: 05/28/2001 Document Revised: 11/09/2015 Document Reviewed: 08/04/2012 Elsevier Interactive Patient Education  2017 [employer]  

## 2016-09-17 NOTE — Progress Notes (Signed)
    Routine Prenatal Care Visit  Subjective  Fetal Movement? yes Contractions? no Leaking Fluid? no Vaginal Bleeding? no  Objective   Vitals:   09/17/16 1449  BP: 116/60  Body mass index is 40.85 kg/m. Filed Weights   09/17/16 1449  Weight: 238 lb (108 kg)  ] Urine dipstick shows negative for all components  General: NAD Pumonary: no increased work of breathing Abdomen: gravid, non-tender, fundal height 33, fetal heart tones 140BPM Extremities: no edema Psychiatric: mood appropriate, affect full   Assessment   31 y.o. G3P1011 at [redacted]w[redacted]d by  11/10/2016, by Ultrasound presenting for routine prenatal visit  third pregnancy Problems (from 08/13/16 to present)    No problems associated with this episode.       Plan   Problem List Items Addressed This Visit      Other   Encounter for supervision of low-risk pregnancy, antepartum - Primary    Other Visit Diagnoses    [redacted] weeks gestation of pregnancy       Obesity affecting pregnancy in third trimester, antepartum         Some increased fatigue, gets winded a little easier, no significant increase in edema, no chest pain, normotensive and only 4lbs weight gain in last 2 weeks

## 2016-09-25 ENCOUNTER — Ambulatory Visit (INDEPENDENT_AMBULATORY_CARE_PROVIDER_SITE_OTHER): Payer: BLUE CROSS/BLUE SHIELD | Admitting: Obstetrics and Gynecology

## 2016-09-25 VITALS — BP 118/74 | Wt 240.0 lb

## 2016-09-25 DIAGNOSIS — O98313 Other infections with a predominantly sexual mode of transmission complicating pregnancy, third trimester: Secondary | ICD-10-CM

## 2016-09-25 DIAGNOSIS — O0993 Supervision of high risk pregnancy, unspecified, third trimester: Secondary | ICD-10-CM

## 2016-09-25 DIAGNOSIS — A630 Anogenital (venereal) warts: Secondary | ICD-10-CM | POA: Insufficient documentation

## 2016-09-25 DIAGNOSIS — E669 Obesity, unspecified: Secondary | ICD-10-CM

## 2016-09-25 DIAGNOSIS — Z3A33 33 weeks gestation of pregnancy: Secondary | ICD-10-CM

## 2016-09-25 NOTE — Progress Notes (Signed)
Pt had some spotting this AM. Has had some sores that she has been "picking at."  States she has spotting this morning after "Picking at" sore last night. +BM, no lof, no ctx. Pelvic shows posterior forchette lesions consistent with condyloma clustered in that area. SSE: no blood in vaginal vault, no bleeding from cervix, which appears closed. Reassurance provided and education regarding genital warts and pregnancy provided.   Physical Exam  Genitourinary:

## 2016-10-02 ENCOUNTER — Ambulatory Visit (INDEPENDENT_AMBULATORY_CARE_PROVIDER_SITE_OTHER): Payer: BLUE CROSS/BLUE SHIELD | Admitting: Obstetrics & Gynecology

## 2016-10-02 VITALS — BP 120/70 | Wt 243.0 lb

## 2016-10-02 DIAGNOSIS — A630 Anogenital (venereal) warts: Secondary | ICD-10-CM

## 2016-10-02 DIAGNOSIS — Z3A34 34 weeks gestation of pregnancy: Secondary | ICD-10-CM

## 2016-10-02 DIAGNOSIS — O0993 Supervision of high risk pregnancy, unspecified, third trimester: Secondary | ICD-10-CM

## 2016-10-02 NOTE — Progress Notes (Signed)
No c/os.  PNV, FMC, Labor precautions. BMI >30 - early 1 hr:67 -  1st trimester screen - declines  A+/RI/VI -  Dating Criteria - LMP + 11 wk [place]  LLP 1.3cm @ 20wks. - f/u @ 28-32 weeks resolved (08/20/16- Review of ULTRASOUND.  I have personally reviewed images and report of recent ultrasound done at [facility].  Also, treatment for vulvar condyloma today    TCA applied to lesions- perineum and peri-anal (approx 7 lesions)

## 2016-10-08 ENCOUNTER — Ambulatory Visit (INDEPENDENT_AMBULATORY_CARE_PROVIDER_SITE_OTHER): Payer: BLUE CROSS/BLUE SHIELD | Admitting: Obstetrics & Gynecology

## 2016-10-08 ENCOUNTER — Encounter: Payer: Self-pay | Admitting: Obstetrics & Gynecology

## 2016-10-08 VITALS — BP 128/70 | Wt 239.0 lb

## 2016-10-08 DIAGNOSIS — O0993 Supervision of high risk pregnancy, unspecified, third trimester: Secondary | ICD-10-CM

## 2016-10-08 DIAGNOSIS — Z3A35 35 weeks gestation of pregnancy: Secondary | ICD-10-CM | POA: Diagnosis not present

## 2016-10-08 DIAGNOSIS — O98313 Other infections with a predominantly sexual mode of transmission complicating pregnancy, third trimester: Secondary | ICD-10-CM

## 2016-10-08 DIAGNOSIS — A630 Anogenital (venereal) warts: Secondary | ICD-10-CM

## 2016-10-08 DIAGNOSIS — E669 Obesity, unspecified: Secondary | ICD-10-CM

## 2016-10-08 NOTE — Progress Notes (Signed)
No c/os.  PNV, FMC, Labor precautions.  BMI >30 - early 1 hr:67 -   1st trimester screen - declines   A+/RI/VI -   Dating Criteria - LMP + 11 wk [place]   LLP  resolved  Also, treatment #2 for vulvar condyloma today    TCA applied to lesions- perineum and mons (no further peri-anal lesions) (approx 7 lesions)

## 2016-10-16 ENCOUNTER — Ambulatory Visit (INDEPENDENT_AMBULATORY_CARE_PROVIDER_SITE_OTHER): Payer: BLUE CROSS/BLUE SHIELD | Admitting: Advanced Practice Midwife

## 2016-10-16 VITALS — BP 120/60 | Wt 239.0 lb

## 2016-10-16 DIAGNOSIS — Z3685 Encounter for antenatal screening for Streptococcus B: Secondary | ICD-10-CM

## 2016-10-16 DIAGNOSIS — Z113 Encounter for screening for infections with a predominantly sexual mode of transmission: Secondary | ICD-10-CM

## 2016-10-16 DIAGNOSIS — Z3A36 36 weeks gestation of pregnancy: Secondary | ICD-10-CM

## 2016-10-16 NOTE — Progress Notes (Signed)
Doing well. Reviewed labor precautions. TCA applied to perineum 2 lesions and mons 1 lesion. Treatment #3. GBS and aptima today.

## 2016-10-18 LAB — GC/CHLAMYDIA PROBE AMP
CHLAMYDIA, DNA PROBE: NEGATIVE
NEISSERIA GONORRHOEAE BY PCR: NEGATIVE

## 2016-10-18 LAB — STREP GP B NAA: Strep Gp B NAA: POSITIVE — AB

## 2016-10-23 ENCOUNTER — Ambulatory Visit (INDEPENDENT_AMBULATORY_CARE_PROVIDER_SITE_OTHER): Payer: BLUE CROSS/BLUE SHIELD | Admitting: Certified Nurse Midwife

## 2016-10-23 VITALS — BP 124/72 | Wt 240.0 lb

## 2016-10-23 DIAGNOSIS — B373 Candidiasis of vulva and vagina: Secondary | ICD-10-CM

## 2016-10-23 DIAGNOSIS — N766 Ulceration of vulva: Secondary | ICD-10-CM

## 2016-10-23 DIAGNOSIS — B3731 Acute candidiasis of vulva and vagina: Secondary | ICD-10-CM

## 2016-10-23 DIAGNOSIS — Z3A37 37 weeks gestation of pregnancy: Secondary | ICD-10-CM

## 2016-10-23 DIAGNOSIS — O0993 Supervision of high risk pregnancy, unspecified, third trimester: Secondary | ICD-10-CM

## 2016-10-23 MED ORDER — NYSTATIN 100000 UNIT/GM EX OINT: TOPICAL_OINTMENT | Freq: Two times a day (BID) | CUTANEOUS | Status: AC

## 2016-10-23 MED ORDER — FLUCONAZOLE 150 MG PO TABS: ORAL_TABLET | ORAL | 0 refills | Status: DC

## 2016-10-23 MED ORDER — TRIAMCINOLONE ACETONIDE 0.1 % EX OINT: 1.0000 "application " | TOPICAL_OINTMENT | Freq: Two times a day (BID) | CUTANEOUS | 0 refills | Status: DC

## 2016-10-23 NOTE — Progress Notes (Signed)
Complains of increasing pressure and soreness in periclitoral area after last TCA application. Has also been having itching of vulva Baby active. Patient tired. Husband not wanting her to have BTL. DIscussed using a LARC pp until sure she does not want further children On exam: small ulceration to right of clitoris where had TCA application. All of labia majora appears irritated Wet prep positive for hyphae. RX for Diflucan 150 mgm #2 tabs and Nystatin and Kenalog ointment [facility] instructions and labor precautions given to patient

## 2016-10-23 NOTE — Progress Notes (Signed)
No c/o's

## 2016-10-30 ENCOUNTER — Ambulatory Visit (INDEPENDENT_AMBULATORY_CARE_PROVIDER_SITE_OTHER): Payer: BLUE CROSS/BLUE SHIELD | Admitting: Obstetrics & Gynecology

## 2016-10-30 VITALS — BP 124/80 | Wt 245.0 lb

## 2016-10-30 DIAGNOSIS — Z3A38 38 weeks gestation of pregnancy: Secondary | ICD-10-CM

## 2016-10-30 DIAGNOSIS — O98313 Other infections with a predominantly sexual mode of transmission complicating pregnancy, third trimester: Secondary | ICD-10-CM

## 2016-10-30 DIAGNOSIS — A630 Anogenital (venereal) warts: Secondary | ICD-10-CM

## 2016-10-30 DIAGNOSIS — O0993 Supervision of high risk pregnancy, unspecified, third trimester: Secondary | ICD-10-CM

## 2016-10-30 NOTE — Patient Instructions (Signed)

## 2016-10-30 NOTE — Progress Notes (Signed)
PNV, FMC, Labor precautions. IUD planned, although still considering zBTL No more lesions (warts) reported.

## 2016-11-08 ENCOUNTER — Ambulatory Visit (INDEPENDENT_AMBULATORY_CARE_PROVIDER_SITE_OTHER): Payer: BLUE CROSS/BLUE SHIELD | Admitting: Certified Nurse Midwife

## 2016-11-08 VITALS — BP 110/60 | HR 106 | Wt 245.0 lb

## 2016-11-08 DIAGNOSIS — O99213 Obesity complicating pregnancy, third trimester: Secondary | ICD-10-CM

## 2016-11-08 DIAGNOSIS — Z3A39 39 weeks gestation of pregnancy: Secondary | ICD-10-CM | POA: Diagnosis not present

## 2016-11-08 DIAGNOSIS — O0993 Supervision of high risk pregnancy, unspecified, third trimester: Secondary | ICD-10-CM

## 2016-11-08 DIAGNOSIS — Z6841 Body Mass Index (BMI) 40.0 and over, adult: Secondary | ICD-10-CM

## 2016-11-08 LAB — FETAL NONSTRESS TEST

## 2016-11-08 NOTE — Progress Notes (Signed)
Pt reports feeling emotional. Baby active. Feels exhausted.

## 2016-11-08 NOTE — Progress Notes (Signed)
Irregular contractions and pelvic pressure Cervix 1.5/75%/-2/ vertex BMI now 42 kg/m2 NST today reactive with baseline 135-140 and accelerations 150s to 170, moderate variability Labor precautions NST/AFI in 1 week if not delivered

## 2016-11-12 ENCOUNTER — Telehealth: Payer: Self-pay

## 2016-11-12 NOTE — Telephone Encounter (Signed)
Pt calling - is due today.  B-H here and there.  Water hasn't broken.  Adv nothing to do but wait.  Labor prec reviewed.  Adv to d/w provider at [facility] re stripping membranes and IOL.

## 2016-11-15 ENCOUNTER — Other Ambulatory Visit: Payer: Self-pay | Admitting: Certified Nurse Midwife

## 2016-11-15 DIAGNOSIS — O288 Other abnormal findings on antenatal screening of mother: Secondary | ICD-10-CM

## 2016-11-18 ENCOUNTER — Ambulatory Visit (INDEPENDENT_AMBULATORY_CARE_PROVIDER_SITE_OTHER): Payer: BLUE CROSS/BLUE SHIELD | Admitting: Certified Nurse Midwife

## 2016-11-18 ENCOUNTER — Ambulatory Visit (INDEPENDENT_AMBULATORY_CARE_PROVIDER_SITE_OTHER): Payer: BLUE CROSS/BLUE SHIELD

## 2016-11-18 ENCOUNTER — Encounter: Payer: Self-pay | Admitting: Certified Nurse Midwife

## 2016-11-18 VITALS — BP 122/82 | HR 75 | Wt 249.0 lb

## 2016-11-18 DIAGNOSIS — O0993 Supervision of high risk pregnancy, unspecified, third trimester: Secondary | ICD-10-CM

## 2016-11-18 DIAGNOSIS — Z3A40 40 weeks gestation of pregnancy: Secondary | ICD-10-CM

## 2016-11-18 DIAGNOSIS — O288 Other abnormal findings on antenatal screening of mother: Secondary | ICD-10-CM | POA: Diagnosis not present

## 2016-11-18 DIAGNOSIS — O99213 Obesity complicating pregnancy, third trimester: Secondary | ICD-10-CM | POA: Diagnosis not present

## 2016-11-18 DIAGNOSIS — Z6841 Body Mass Index (BMI) 40.0 and over, adult: Secondary | ICD-10-CM | POA: Diagnosis not present

## 2016-11-18 LAB — FETAL NONSTRESS TEST
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
## 2016-11-18 NOTE — H&P (Deleted)
  The note originally documented on this encounter has been moved the the encounter in which it belongs.  

## 2016-11-18 NOTE — Progress Notes (Signed)
Irregular contractions NST reactive with baseline 135 and accelerations to 150s to 160, moderate variability AFI: 7.89 cm / vertex Cervix: 3/70%/-2 to -3/ ant/ soft IOL set up for 8 June (offered 6 June, but patient declined) for Pitocin H&P and orders done Labor precautions NST scheduled for 7 June if undelivered.

## 2016-11-19 ENCOUNTER — Inpatient Hospital Stay
Admission: EM | Admit: 2016-11-19 | Discharge: 2016-11-21 | DRG: 774 | Disposition: A | Discharge status: Home / Self Care | Payer: BLUE CROSS/BLUE SHIELD | Attending: Obstetrics & Gynecology | Admitting: Obstetrics & Gynecology

## 2016-11-19 DIAGNOSIS — D649 Anemia, unspecified: Secondary | ICD-10-CM | POA: Diagnosis present

## 2016-11-19 DIAGNOSIS — O99214 Obesity complicating childbirth: Secondary | ICD-10-CM | POA: Diagnosis present

## 2016-11-19 DIAGNOSIS — E669 Obesity, unspecified: Secondary | ICD-10-CM | POA: Diagnosis present

## 2016-11-19 DIAGNOSIS — O9902 Anemia complicating childbirth: Secondary | ICD-10-CM | POA: Diagnosis present

## 2016-11-19 DIAGNOSIS — O99824 Streptococcus B carrier state complicating childbirth: Secondary | ICD-10-CM | POA: Diagnosis not present

## 2016-11-19 DIAGNOSIS — Z3A41 41 weeks gestation of pregnancy: Secondary | ICD-10-CM

## 2016-11-19 DIAGNOSIS — Z8759 Personal history of other complications of pregnancy, childbirth and the puerperium: Secondary | ICD-10-CM

## 2016-11-19 DIAGNOSIS — Z3493 Encounter for supervision of normal pregnancy, unspecified, third trimester: Secondary | ICD-10-CM | POA: Diagnosis present

## 2016-11-19 DIAGNOSIS — O9832 Other infections with a predominantly sexual mode of transmission complicating childbirth: Secondary | ICD-10-CM | POA: Diagnosis present

## 2016-11-19 DIAGNOSIS — A630 Anogenital (venereal) warts: Secondary | ICD-10-CM | POA: Diagnosis present

## 2016-11-19 LAB — CBC
HEMATOCRIT: 34.6 % — AB (ref 35.0–47.0)
HEMOGLOBIN: 11.8 g/dL — AB (ref 12.0–16.0)
MCH: 29.6 pg (ref 26.0–34.0)
MCHC: 34.1 g/dL (ref 32.0–36.0)
MCV: 87 fL (ref 80.0–100.0)
Platelets: 210 10*3/uL (ref 150–440)
RBC: 3.98 MIL/uL (ref 3.80–5.20)
RDW: 14.1 % (ref 11.5–14.5)
WBC: 8.2 10*3/uL (ref 3.6–11.0)

## 2016-11-19 LAB — TYPE AND SCREEN
ABO/RH(D): A POS
ANTIBODY SCREEN: NEGATIVE

## 2016-11-19 MED ORDER — SODIUM CHLORIDE 0.9 % IV SOLN
2.0000 g | Freq: Once | INTRAVENOUS | Status: AC
  Administered 2016-11-19: 2 g via INTRAVENOUS
  Filled 2016-11-19: qty 2000

## 2016-11-19 MED ORDER — OXYTOCIN BOLUS FROM INFUSION
500.0000 mL | Freq: Once | INTRAVENOUS | Status: AC
  Administered 2016-11-19: 500 mL via INTRAVENOUS

## 2016-11-19 MED ORDER — PRENATAL MULTIVITAMIN CH
1.0000 | ORAL_TABLET | Freq: Every day | ORAL | Status: DC
  Administered 2016-11-19: 1 via ORAL
  Filled 2016-11-19: qty 1

## 2016-11-19 MED ORDER — ONDANSETRON HCL 4 MG/2ML IJ SOLN: 4.0000 mg | INTRAMUSCULAR | Status: DC | PRN

## 2016-11-19 MED ORDER — SENNOSIDES-DOCUSATE SODIUM 8.6-50 MG PO TABS: 2.0000 | ORAL_TABLET | ORAL | Status: DC

## 2016-11-19 MED ORDER — MISOPROSTOL 200 MCG PO TABS: 800.0000 ug | ORAL_TABLET | Freq: Once | ORAL | Status: DC | PRN

## 2016-11-19 MED ORDER — WITCH HAZEL-GLYCERIN EX PADS: 1.0000 "application " | MEDICATED_PAD | CUTANEOUS | Status: DC | PRN

## 2016-11-19 MED ORDER — IBUPROFEN 600 MG PO TABS
600.0000 mg | ORAL_TABLET | Freq: Four times a day (QID) | ORAL | Status: DC
  Administered 2016-11-19 (×2): 600 mg via ORAL
  Filled 2016-11-19 (×3): qty 1

## 2016-11-19 MED ORDER — OXYTOCIN 40 UNITS IN LACTATED RINGERS INFUSION - SIMPLE MED: 2.5000 [IU]/h | INTRAVENOUS | Status: DC

## 2016-11-19 MED ORDER — DIBUCAINE 1 % RE OINT: 1.0000 "application " | TOPICAL_OINTMENT | RECTAL | Status: DC | PRN

## 2016-11-19 MED ORDER — MISOPROSTOL 200 MCG PO TABS
ORAL_TABLET | ORAL | Status: AC
  Filled 2016-11-19: qty 4

## 2016-11-19 MED ORDER — OXYCODONE HCL 5 MG PO TABS: 10.0000 mg | ORAL_TABLET | ORAL | Status: DC | PRN

## 2016-11-19 MED ORDER — AMPICILLIN SODIUM 1 G IJ SOLR
1.0000 g | INTRAMUSCULAR | Status: DC
  Filled 2016-11-19 (×2): qty 1000

## 2016-11-19 MED ORDER — LIDOCAINE HCL (PF) 1 % IJ SOLN: 30.0000 mL | INTRAMUSCULAR | Status: DC | PRN

## 2016-11-19 MED ORDER — LACTATED RINGERS IV SOLN
INTRAVENOUS | Status: DC
  Administered 2016-11-19: 04:00:00 via INTRAVENOUS

## 2016-11-19 MED ORDER — OXYTOCIN 40 UNITS IN LACTATED RINGERS INFUSION - SIMPLE MED: 1.0000 m[IU]/min | INTRAVENOUS | Status: DC

## 2016-11-19 MED ORDER — FENTANYL 2.5 MCG/ML W/ROPIVACAINE 0.2% IN NS 100 ML EPIDURAL INFUSION (ARMC-ANES)
EPIDURAL | Status: AC
  Filled 2016-11-19: qty 100

## 2016-11-19 MED ORDER — SIMETHICONE 80 MG PO CHEW: 80.0000 mg | CHEWABLE_TABLET | ORAL | Status: DC | PRN

## 2016-11-19 MED ORDER — ONDANSETRON HCL 4 MG/2ML IJ SOLN: 4.0000 mg | Freq: Four times a day (QID) | INTRAMUSCULAR | Status: DC | PRN

## 2016-11-19 MED ORDER — DIPHENHYDRAMINE HCL 25 MG PO CAPS: 25.0000 mg | ORAL_CAPSULE | Freq: Four times a day (QID) | ORAL | Status: DC | PRN

## 2016-11-19 MED ORDER — LIDOCAINE HCL (PF) 1 % IJ SOLN
INTRAMUSCULAR | Status: AC
  Filled 2016-11-19: qty 30

## 2016-11-19 MED ORDER — AMMONIA AROMATIC IN INHA: 0.3000 mL | Freq: Once | RESPIRATORY_TRACT | Status: DC | PRN

## 2016-11-19 MED ORDER — OXYTOCIN 10 UNIT/ML IJ SOLN
INTRAMUSCULAR | Status: AC
  Filled 2016-11-19: qty 2

## 2016-11-19 MED ORDER — BENZOCAINE-MENTHOL 20-0.5 % EX AERO: 1.0000 "application " | INHALATION_SPRAY | CUTANEOUS | Status: DC | PRN

## 2016-11-19 MED ORDER — OXYTOCIN 40 UNITS IN LACTATED RINGERS INFUSION - SIMPLE MED
INTRAVENOUS | Status: AC
  Filled 2016-11-19: qty 1000

## 2016-11-19 MED ORDER — OXYCODONE HCL 5 MG PO TABS: 5.0000 mg | ORAL_TABLET | ORAL | Status: DC | PRN

## 2016-11-19 MED ORDER — ACETAMINOPHEN 325 MG PO TABS: 650.0000 mg | ORAL_TABLET | ORAL | Status: DC | PRN

## 2016-11-19 MED ORDER — AMMONIA AROMATIC IN INHA
RESPIRATORY_TRACT | Status: AC
  Filled 2016-11-19: qty 10

## 2016-11-19 MED ORDER — TETANUS-DIPHTH-ACELL PERTUSSIS 5-2.5-18.5 LF-MCG/0.5 IM SUSP
0.5000 mL | Freq: Once | INTRAMUSCULAR | Status: DC
  Filled 2016-11-19: qty 0.5

## 2016-11-19 MED ORDER — TERBUTALINE SULFATE 1 MG/ML IJ SOLN: 0.2500 mg | Freq: Once | INTRAMUSCULAR | Status: DC | PRN

## 2016-11-19 MED ORDER — LACTATED RINGERS IV SOLN: 500.0000 mL | INTRAVENOUS | Status: DC | PRN

## 2016-11-19 MED ORDER — AMPICILLIN SODIUM 1 G IJ SOLR: 1.0000 g | INTRAMUSCULAR | Status: DC

## 2016-11-19 MED ORDER — ONDANSETRON HCL 4 MG PO TABS: 4.0000 mg | ORAL_TABLET | ORAL | Status: DC | PRN

## 2016-11-19 MED ORDER — IBUPROFEN 600 MG PO TABS
ORAL_TABLET | ORAL | Status: AC
  Filled 2016-11-19: qty 1

## 2016-11-19 MED ORDER — COCONUT OIL OIL
1.0000 "application " | TOPICAL_OIL | Status: DC | PRN
  Filled 2016-11-19: qty 120

## 2016-11-19 NOTE — Lactation Note (Signed)
This note was copied from a baby's chart. Lactation Consultation Note  Patient Name: [name] Woods Today's Date: 11/19/2016  During LC rounds, Mom reports good that baby breastfeeds well; denies pains or problems. She had questions about how to tell baby is well nourished so I instructed her and reviewed BF booklet. We both heard many swallows while I was there for end of feeding. She has LC contact info   Maternal Data    Feeding Feeding Type: Breast Fed Length of feed: 30 min (per mom report)  [facility]                      Lactation Tools Discussed/Used     Consult Status      Sunday CornSandra Clark [name] Woods 11/19/2016, 1:45 PM

## 2016-11-19 NOTE — Lactation Note (Signed)
This note was copied from a baby's chart. Observed Kylah breastfeeding.  Mom latched without difficulty.  Mom reports Tristan SchroederKylah wanting to cluster feed so tried to give her pacifier but she gagged when offered.  Reviewed risks of pacifier to successful breast feeding and to avoid if possible.  Mom reports the way she sucked on the pacifier made her a little gassy anyway.  Mom concerned that she is not getting enough from breast.  Demonstrated hand expression of a few drops of colostrum and reviewed how to listen for swallows at breast and void and stool counts.  Lactation number written on white board and encouraged to call for questions, concerns or assistance.

## 2016-11-19 NOTE — H&P (Signed)
Update to H&P done by Farrel Connersolleen Gutierrez on 11/18/2016  Patient arrived to L&D around 3 am on 11/19/2016 with contractions every 2-4 minutes. She was dilated to 4/70/-3 at that time. Admission orders were activated. Patient was awaiting epidural and found to be complete at 4:24 AM and baby was delivered at 4:33 AM. She received one dose of antibiotics for GBS positive.

## 2016-11-19 NOTE — Discharge Summary (Signed)
  OB Discharge Summary     Patient Name: [name] Woods DOB: 12/22/1985 MRN: [id number]  Date of admission: 11/19/2016 Delivering provider: Tresea MallJane Gledhill, CNM  Date of Delivery: 11/19/2016  Date of discharge: 11/21/16  Admitting diagnosis: 41 wks preg contractions Intrauterine pregnancy: 383w0d     Secondary diagnosis: obesity, anemia, vulvar condyloma     Discharge diagnosis: Term Pregnancy Delivered                                                                                                Post partum procedures:none  Augmentation: none  Complications: None  Hospital course:  Onset of Labor With Vaginal Delivery      31 y.o. yo G3P1011 at 123w0d was admitted in Active Labor on 11/19/2016.  Patient had an uncomplicated labor course as follows:  Membrane Rupture Time/Date: 4:25 AM ,11/19/2016   Patient had delivery of viable female on 11/19/2016 Details of delivery can be found in a separate note  Patient had an uncomplicated postpartum course.   She is ambulating, tolerating a regular diet, passing flatus, and urinating well.  Patient is discharged home in stable condition on day 2.   Physical exam  Vitals:   11/20/16 1955 11/20/16 2338 11/21/16 0353 11/21/16 0809  BP: 132/79 135/70 (!) 159/79 122/71  Pulse: 80 87 81 77  Resp:  18 18 18   Temp: 98.3 F (36.8 C) 97.8 F (36.6 C) 98.2 F (36.8 C) 98.9 F (37.2 C)  TempSrc: Oral Oral Oral Oral  SpO2: 100%  100% 100%   General: alert, cooperative and no distress Lochia: appropriate Uterine Fundus: firm Incision: N/A DVT Evaluation: No evidence of DVT seen on physical exam.  Labs: Lab Results  Component Value Date   WBC 8.2 11/19/2016   HGB 11.8 (L) 11/19/2016   HCT 34.6 (L) 11/19/2016   MCV 87.0 11/19/2016   PLT 210 11/19/2016    Discharge instruction: per After Visit Summary.  Medications:  Allergies as of 11/21/2016   No Known Allergies     Medication List    TAKE these medications   triamcinolone ointment  0.1 % Commonly known as:  KENALOG Apply 1 application topically 2 (two) times daily.       Diet: routine diet  Activity: Advance as tolerated. Pelvic rest for 6 weeks.   Outpatient follow up: Follow-up Information    Tresea MallGledhill, Jane, CNM. Schedule an appointment as soon as possible for a visit in 6 week(s).   Specialty:  Obstetrics Why:  postpartum follow up visit and IUD insertion Contact information: [address] [place] [address] [phone]             Postpartum contraception: IUD Rhogam Given postpartum: NA Rubella vaccine given postpartum: NA Varicella vaccine given postpartum: NA TDaP given antepartum or postpartum: done/UTD  Newborn Data: Live born female Birth Weight: 8 lb 0.8 oz (3650 g) APGAR: 8, 9   Baby Feeding: Bottle and Breast  Disposition:home with mother  SIGNED:  Letitia Libraobert Paul Pedram Goodchild, MD 11/21/2016 9:34 AM

## 2016-11-20 LAB — RPR: RPR Ser Ql: NONREACTIVE

## 2016-11-20 NOTE — Progress Notes (Signed)
Patient ID: [name] Woods, female   DOB: 09/18/1985, 31 y.o.   MRN: [id number]  Obstetric Postpartum Daily Progress Note Subjective:  31 y.o. N8G9562G3P2012 postpartum day #1 status post vaginal delivery.  She is ambulating, is tolerating po, is voiding spontaneously.  Her pain is well controlled on PO pain medications. Her lochia is less than menses. Denies HA, visual changes and RUQ pain. Refused CBC today.   Medications SCHEDULED MEDICATIONS  . ibuprofen  600 mg Oral Q6H  . prenatal multivitamin  1 tablet Oral Q1200  . senna-docusate  2 tablet Oral Q24H  . Tdap  0.5 mL Intramuscular Once    MEDICATION INFUSIONS    PRN MEDICATIONS  acetaminophen, benzocaine-Menthol, coconut oil, witch hazel-glycerin **AND** dibucaine, diphenhydrAMINE, ondansetron **OR** ondansetron (ZOFRAN) IV, oxyCODONE, oxyCODONE, simethicone    Objective:   Vitals:   11/19/16 2346 11/20/16 0400 11/20/16 0718 11/20/16 1134  BP: 127/68 140/73 (!) 148/88 (!) 150/75  Pulse: 75 88 96 88  Resp: 18 18 20 18   Temp: 98.2 F (36.8 C) 98.4 F (36.9 C) 98.5 F (36.9 C) 98 F (36.7 C)  TempSrc: Oral Oral Oral Oral  SpO2: 100% 99% 100% 99%    Current Vital Signs 24h Vital Sign Ranges  T 98 F (36.7 C) Temp  Avg: 98.2 F (36.8 C)  Min: 97.7 F (36.5 C)  Max: 98.5 F (36.9 C)  BP (!) 150/75 BP  Min: 127/68  Max: 150/75  HR 88 Pulse  Avg: 89  Min: 75  Max: 98  RR 18 Resp  Avg: 18.3  Min: 18  Max: 20  SaO2 99 % Not Delivered SpO2  Avg: 99.7 %  Min: 99 %  Max: 100 %       24 Hour I/O Current Shift I/O  Time Ins Outs 06/05 0701 - 06/06 0700 In: 240 [P.O.:240] Out: 1600 [Urine:1600] No intake/output data recorded.  General: NAD Pulmonary: no increased work of breathing Abdomen: non-distended, non-tender, fundus firm at level of umbilicus Extremities: no edema, no erythema, no tenderness  Labs:   Recent Labs Lab 11/19/16 0321  WBC 8.2  HGB 11.8*  HCT 34.6*  PLT 210     Assessment:   31 y.o. [id number]  postpartum day # 1 status post SVD, mildly elevated BPs  Plan:   1) Acute blood loss anemia - hemodynamically stable and asymptomatic - po ferrous sulfate  2) A POS / Rubella Immune (10/26 0000)/ Varicella Immune  3) TDAP status unknown, will offer prior to discharge  4) breast and formula feeding /Contraception = IUD vs BTL   5) elevated BPs. Continue to monitor.  So sx of severe disease.  5) Disposition - likely tomorrow.   Thomasene MohairStephen Khaleed Holan, MD 11/20/2016 11:39 AM

## 2016-11-20 NOTE — Lactation Note (Signed)
This note was copied from a baby's chart. Lactation Consultation Note  Patient Name: [name] Woods Today's Date: 11/20/2016 Reason for consult: Initial assessment   Maternal Data    Feeding Feeding Type: Bottle Fed - Formula Nipple Type: Slow - flow  LATCH Score/Interventions                      Lactation Tools Discussed/Used     Consult Status Consult Status: Complete  LC explained the physiology of breastfeeding to pt after she stated she wasn't "making enough" milk for baby and wanted to bottlefeed because she feels that it is easier for her to do so at this time. Mom still may want to try breastfeeding later once her milk "comes in" Explained to mom how establishing breastfeeding works, but mom says she will prefer formula feeding while in hospital, but will reach out to Lactation Dept. If anything changes.   [name] Woods 11/20/2016, 2:25 PM

## 2016-11-21 ENCOUNTER — Encounter: Payer: BLUE CROSS/BLUE SHIELD | Admitting: Certified Nurse Midwife

## 2016-11-21 ENCOUNTER — Telehealth: Payer: Self-pay | Admitting: Obstetrics & Gynecology

## 2016-11-21 NOTE — Progress Notes (Signed)
Patient ID: [name] Woods, female   DOB: 08/03/1985, 31 y.o.   MRN: [id number]  Obstetric Postpartum Daily Progress Note Subjective:  31 y.o. J4N8295G3P2012 postpartum day #2 status post vaginal delivery.  She is ambulating, is tolerating po, is voiding spontaneously.  Her pain is well controlled on PO pain medications. Her lochia is less than menses. Denies HA, visual changes and RUQ pain.    Medications SCHEDULED MEDICATIONS  . ibuprofen  600 mg Oral Q6H  . prenatal multivitamin  1 tablet Oral Q1200  . senna-docusate  2 tablet Oral Q24H  . Tdap  0.5 mL Intramuscular Once    MEDICATION INFUSIONS    PRN MEDICATIONS  acetaminophen, benzocaine-Menthol, coconut oil, witch hazel-glycerin **AND** dibucaine, diphenhydrAMINE, ondansetron **OR** ondansetron (ZOFRAN) IV, oxyCODONE, oxyCODONE, simethicone    Objective:   Vitals:   11/20/16 1955 11/20/16 2338 11/21/16 0353 11/21/16 0809  BP: 132/79 135/70 (!) 159/79 122/71  Pulse: 80 87 81 77  Resp:  18 18 18   Temp: 98.3 F (36.8 C) 97.8 F (36.6 C) 98.2 F (36.8 C) 98.9 F (37.2 C)  TempSrc: Oral Oral Oral Oral  SpO2: 100%  100% 100%    Current Vital Signs 24h Vital Sign Ranges  T 98.9 F (37.2 C) Temp  Avg: 98.2 F (36.8 C)  Min: 97.8 F (36.6 C)  Max: 98.9 F (37.2 C)  BP 122/71 BP  Min: 121/55  Max: 159/79  HR 77 Pulse  Avg: 84  Min: 77  Max: 88  RR 18 Resp  Avg: 18  Min: 18  Max: 18  SaO2 100 % Not Delivered SpO2  Avg: 99.8 %  Min: 99 %  Max: 100 %       24 Hour I/O Current Shift I/O  Time Ins Outs No intake/output data recorded. No intake/output data recorded.  General: NAD Pulmonary: no increased work of breathing Abdomen: non-distended, non-tender, fundus firm at level of umbilicus Extremities: no edema, no erythema, no tenderness  Assessment:   31 y.o. A2Z3086G3P2012 postpartum day #2 status post SVD, mostly normal BPs  Plan:   1) Acute blood loss anemia - hemodynamically stable and asymptomatic - po ferrous sulfate  2)  A POS / Rubella Immune (10/26 0000)/ Varicella Immune  3) TDAP   4) breast and formula feeding /Contraception = IUD vs BTL  [facility]  5) elevated BPs, s/sx to monitor for discussed, mostly normal now  5) Disposition - home  Annamarie MajorPaul Merline Perkin, MD, Merlinda FrederickFACOG [facility], [facility] 11/21/2016  9:36 AM

## 2016-11-21 NOTE — Telephone Encounter (Signed)
-----   Message from Nadara Mustardobert P Harris, MD sent at 11/21/2016  9:33 AM EDT ----- Regarding: Appt for 6 week PP with IUD, Jane Sch and let hospital know, discharging today

## 2016-11-21 NOTE — Progress Notes (Signed)
Patient discharged home with infant and family. Discharge instructions, prescriptions and follow up appointment given to and reviewed with patient and family. Patient verbalized understanding. Escorted out via wheelchair by auxiliary.  

## 2016-11-21 NOTE — Telephone Encounter (Signed)
I called and left voicemail for pt to call back to be schedule for 6 week PP

## 2016-12-30 ENCOUNTER — Ambulatory Visit (INDEPENDENT_AMBULATORY_CARE_PROVIDER_SITE_OTHER): Payer: BLUE CROSS/BLUE SHIELD | Admitting: Obstetrics & Gynecology

## 2016-12-30 ENCOUNTER — Encounter: Payer: Self-pay | Admitting: Obstetrics & Gynecology

## 2016-12-30 DIAGNOSIS — Z30430 Encounter for insertion of intrauterine contraceptive device: Secondary | ICD-10-CM | POA: Diagnosis not present

## 2016-12-30 NOTE — Patient Instructions (Signed)

## 2016-12-30 NOTE — Progress Notes (Signed)
  OBSTETRICS POSTPARTUM CLINIC PROGRESS NOTE  Subjective:     [name] Woods is a 31 y.o. 323P2012 female who presents for a postpartum visit. She is 6 weeks postpartum following a Term pregnancy and delivery by Vaginal, no problems at delivery.  I have fully reviewed the prenatal and intrapartum course. Anesthesia: none.  Postpartum course has been complicated by uncomplicated.  Baby is feeding by Bottle and Breast.  Bleeding: patient has not  resumed menses.  Bowel function is normal. Bladder function is normal.  Patient is not sexually active. Contraception method desired is IUD.  Postpartum depression screening: negative.  The following portions of the patient's history were reviewed and updated as appropriate: allergies, current medications, past family history, past medical history, past social history, past surgical history and problem list.  Review of Systems Pertinent items noted in HPI and remainder of comprehensive ROS otherwise negative.  Objective:    BP 124/78   Pulse 64   Ht 5\' 3"  (1.6 m)   Wt 226 lb (102.5 kg)   LMP 12/25/2016   BMI 40.03 kg/m   General:  alert and no distress   Breasts:  inspection negative, no nipple discharge or bleeding, no masses or nodularity palpable  Lungs: clear to auscultation bilaterally  Heart:  regular rate and rhythm, S1, S2 normal, no murmur, click, rub or gallop  Abdomen: soft, non-tender; bowel sounds normal; no masses,  no organomegaly.     Vulva:  normal  Vagina: normal vagina, no discharge, exudate, lesion, or erythema  Cervix:  no cervical motion tenderness and no lesions  Corpus: normal size, contour, position, consistency, mobility, non-tender  Adnexa:  normal adnexa and no mass, fullness, tenderness  Rectal Exam: Not performed.        IUD PROCEDURE NOTE:  [name] Woods is a 31 y.o. N8G9562G3P2012 here for IUD insertion. No GYN concerns.  Last pap smear was normal.  IUD Insertion Procedure Note- PARAGUARD Patient  identified, informed consent performed, consent signed.   Discussed risks of irregular bleeding, cramping, infection, malpositioning or misplacement of the IUD outside the uterus which may require further procedure such as laparoscopy, risk of failure <1%. Time out was performed.  Urine pregnancy test negative.  A bimanual exam showed the uterus to be midposition.  Speculum placed in the vagina.  Cervix visualized.  Cleaned with Betadine x 2.  Grasped anteriorly with a single tooth tenaculum.  Uterus sounded to 7 cm.   IUD placed per manufacturer's recommendations.  Strings trimmed to 3 cm. Tenaculum was removed, good hemostasis noted.  Patient tolerated procedure well.   Patient was given post-procedure instructions.  She was advised to have backup contraception for one week.  Patient was also asked to check IUD strings periodically and follow up in 4 weeks for IUD check.  Assessment:  Post Partum Care visit 1. Postpartum care following vaginal delivery IUD for Contraception management- Paraguard   Plan:  See orders and Patient Instructions Contraceptive counseling for IUD Follow up in: 4 weeks or as needed.  PAP due Oct 2018  Annamarie MajorPaul Raekwon Winkowski, MD, Merlinda FrederickFACOG [facility], [facility] 12/30/2016  8:39 AM

## 2017-01-24 ENCOUNTER — Encounter: Payer: Self-pay | Admitting: Obstetrics & Gynecology

## 2017-01-24 ENCOUNTER — Ambulatory Visit (INDEPENDENT_AMBULATORY_CARE_PROVIDER_SITE_OTHER): Payer: BLUE CROSS/BLUE SHIELD | Admitting: Obstetrics & Gynecology

## 2017-01-24 VITALS — BP 118/74 | Wt 226.0 lb

## 2017-01-24 DIAGNOSIS — Z30431 Encounter for routine checking of intrauterine contraceptive device: Secondary | ICD-10-CM

## 2017-01-24 NOTE — Progress Notes (Signed)
  History of Present Illness:  [name] Woods is a 31 y.o. that had a Paragard IUD placed approximately 4 weeks ago. Since that time, she states that she has had no to min pain w last period and flow was a lttle heavy for her  PMHx: She  has no past medical history on file. Also,  has a past surgical history that includes Wisdom tooth extraction and Dilation and curettage of uterus (06/17/2014)., family history includes Diabetes in her father; Hypertension in her mother.,  reports that she has never smoked. She has never used smokeless tobacco. She reports that she does not drink alcohol or use drugs. Current Meds  Medication Sig  . PARAGARD INTRAUTERINE COPPER IU by Intrauterine route.  . [DISCONTINUED] levonorgestrel (MIRENA) 20 MCG/24HR IUD 1 each by Intrauterine route once.   Current Facility-Administered Medications for the 01/24/17 encounter (Office Visit) with Nadara MustardHarris, Lora Glomski P, MD  Medication  . nystatin ointment (MYCOSTATIN)  .  Also, has No Known Allergies..  Review of Systems  All other systems reviewed and are negative.   Physical Exam:  BP 118/74   Wt 226 lb (102.5 kg)   LMP 01/13/2017   BMI 40.03 kg/m  Body mass index is 40.03 kg/m. Constitutional: Well nourished, well developed female in no acute distress.  Abdomen: diffusely non tender to palpation, non distended, and no masses, hernias Neuro: Grossly intact Psych:  Normal mood and affect.    Pelvic exam:  Two IUD strings present seen coming from the cervical os. EGBUS, vaginal vault and cervix: within normal limits  Assessment: IUD strings present in proper location; pt doing well  Plan: She was told to continue to use barrier contraception, in order to prevent any STIs, and to take a home pregnancy test or call us if she ever thinks she may be pregnant, and that her IUD expires in 10 years.  She was amenable to this plan and we will see her back in 1 year/PRN.  [name], MD, Merlinda FrederickFACOG [facility], [facility] 01/24/2017  9:22 AM

## 2017-12-08 ENCOUNTER — Ambulatory Visit (INDEPENDENT_AMBULATORY_CARE_PROVIDER_SITE_OTHER): Payer: BLUE CROSS/BLUE SHIELD | Admitting: Obstetrics and Gynecology

## 2017-12-08 ENCOUNTER — Other Ambulatory Visit: Payer: Self-pay

## 2017-12-08 ENCOUNTER — Encounter: Payer: Self-pay | Admitting: Obstetrics and Gynecology

## 2017-12-08 VITALS — BP 118/76 | HR 81 | Ht 63.0 in | Wt 242.0 lb

## 2017-12-08 DIAGNOSIS — N941 Unspecified dyspareunia: Secondary | ICD-10-CM | POA: Diagnosis not present

## 2017-12-08 DIAGNOSIS — Z30431 Encounter for routine checking of intrauterine contraceptive device: Secondary | ICD-10-CM | POA: Diagnosis not present

## 2017-12-08 DIAGNOSIS — K5900 Constipation, unspecified: Secondary | ICD-10-CM

## 2017-12-08 DIAGNOSIS — R102 Pelvic and perineal pain: Secondary | ICD-10-CM

## 2017-12-08 NOTE — Patient Instructions (Signed)
I value your feedback and entrusting us with your care. If you get a [name] Woods patient survey, I would appreciate you taking the time to let us know about your experience today. Thank you! 
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
## 2017-12-09 ENCOUNTER — Other Ambulatory Visit: Payer: BLUE CROSS/BLUE SHIELD

## 2017-12-11 ENCOUNTER — Ambulatory Visit (INDEPENDENT_AMBULATORY_CARE_PROVIDER_SITE_OTHER): Payer: BLUE CROSS/BLUE SHIELD | Admitting: Obstetrics and Gynecology

## 2017-12-11 ENCOUNTER — Encounter: Payer: Self-pay | Admitting: Obstetrics and Gynecology

## 2017-12-11 ENCOUNTER — Ambulatory Visit (INDEPENDENT_AMBULATORY_CARE_PROVIDER_SITE_OTHER): Payer: BLUE CROSS/BLUE SHIELD

## 2017-12-11 VITALS — BP 120/80 | Ht 65.0 in | Wt 246.0 lb

## 2017-12-11 DIAGNOSIS — Z30432 Encounter for removal of intrauterine contraceptive device: Secondary | ICD-10-CM

## 2017-12-11 DIAGNOSIS — T8332XA Displacement of intrauterine contraceptive device, initial encounter: Secondary | ICD-10-CM

## 2017-12-11 DIAGNOSIS — N941 Unspecified dyspareunia: Secondary | ICD-10-CM | POA: Diagnosis not present

## 2017-12-11 DIAGNOSIS — Z30431 Encounter for routine checking of intrauterine contraceptive device: Secondary | ICD-10-CM

## 2017-12-11 DIAGNOSIS — R102 Pelvic and perineal pain: Secondary | ICD-10-CM

## 2017-12-11 NOTE — Progress Notes (Signed)
   Chief Complaint  Patient presents with  . Contraception     History of Present Illness:  [name] Woods is a 32 y.o. that had a Paragard IUD placed approximately 1 years ago. She was doing well until recently when she started having excruciating pain with sex for the past few months. Pain lasts a few hrs to a couple days. No DUB/postcoital bleeding sx. Menses are monthly. U/S done today showed IUD in wrong place. Here for removal. Would like TL anyway and has appt with Dr. Jerene PitchSchuman for consultation 12/19/17.    ULTRASOUND REPORT  Patient Name: [name] Woods DOB: 06/17/1986 MRN: [id number]  Location: [facility]  Date of Service: 12/11/2017    Indications:IUD check Findings:  The uterus is anteverted and measures 8.85 x 5.89x 4.66cm. Echo texture is homogenous without evidence of focal masses.  The Endometrium measures 6.90 mm. - IUD is not in the correct position; placed low in body of uterus into cervical canal  Right Ovary measures 2.96 x 2.06x 1.77 cm. It is normal in appearance. Left Ovary measures 4.26 x 3.14 x 2.23 cm. It is normal in appearance. Survey of the adnexa demonstrates no adnexal masses. There is no free fluid in the cul de sac.  Impression: 1. IUD is not in the correct position; placed low in body of uterus into cervical canal  Recommendations: 1.Clinical correlation with the patient's History and Physical Exam.  Willette AlmaKristen Priestley, RDMS, RVT  BP 120/80   Ht 5\' 5"  (1.651 m)   Wt 246 lb (111.6 kg)   LMP 11/17/2017 (Approximate)   BMI 40.94 kg/m   Pelvic exam:  Two IUD strings present seen coming from the cervical os. EGBUS, vaginal vault and cervix: within normal limits  IUD Removal Strings of IUD identified and grasped with [facility]. IUD would not come out and strings came off. [place] used to try to remove IUD from endocx canal but pt couldn't tolerate procedure.    Assessment/Plan:  Malpositioned intrauterine device (IUD), initial  encounter  Encounter for removal of intrauterine contraceptive device (IUD) - FAILED REMOVAL.  Pelvic pain - With sex only. Due to malpositioned IUD. No sx if not sex active, so can follow for now till IUD can be removed with TL procedure.   Dyspareunia in female - Most likely due to malpositioned IUD. Abstinence until IUD removed.   UNABLE TO REMOVE IUD AND PT COULDN'T TOLERATE REMOVAL ATTEMPTS; Pt has appt for BTL anyway and wants to wait till surg for IUD removal.   Alicia B. Copland, PA-C 12/11/2017 4:34 PM

## 2017-12-11 NOTE — Patient Instructions (Signed)
I value your feedback and entrusting us with your care. If you get a [place] patient survey, I would appreciate you taking the time to let us know about your experience today. Thank you! 

## 2017-12-16 ENCOUNTER — Ambulatory Visit: Payer: BLUE CROSS/BLUE SHIELD | Admitting: Obstetrics and Gynecology

## 2017-12-19 ENCOUNTER — Telehealth: Payer: Self-pay | Admitting: Obstetrics and Gynecology

## 2017-12-19 ENCOUNTER — Ambulatory Visit (INDEPENDENT_AMBULATORY_CARE_PROVIDER_SITE_OTHER): Payer: Medicaid Other | Admitting: Obstetrics and Gynecology

## 2017-12-19 ENCOUNTER — Encounter: Payer: Self-pay | Admitting: Obstetrics and Gynecology

## 2017-12-19 VITALS — BP 120/76 | HR 69 | Ht 64.0 in | Wt 240.0 lb

## 2017-12-19 DIAGNOSIS — Z3009 Encounter for other general counseling and advice on contraception: Secondary | ICD-10-CM | POA: Diagnosis not present

## 2017-12-19 NOTE — Telephone Encounter (Signed)
-----   Message from Natale Milchhristanna R Schuman, MD sent at 12/19/2017 12:36 PM EDT ----- Surgery Booking Request Patient Full Name:  [name] Woods  MRN: [id number]  DOB: 02/26/1986  Surgeon: Natale Milchhristanna R Schuman, MD  Requested Surgery Date and Time: July 2019 Primary Diagnosis AND Code: surgical sterilization desired Secondary Diagnosis and Code:  Surgical Procedure: laparoscopic bilateral salpingectomy with paragard removal, possible hysteroscopy L&D Notification: No Admission Status: same day surgery Length of Surgery: 2 hour Special Case Needs: none H&P: no (date) Phone Interview???: yes Interpreter: Language:  Medical Clearance: none Special Scheduling Instructions:
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
## 2017-12-19 NOTE — Telephone Encounter (Signed)
Lmtrc

## 2017-12-23 NOTE — Telephone Encounter (Signed)
Patient is scheduled for in-office paragard removal and reinsertion on Thursday, 01/01/18 @ 10:50am.

## 2017-12-24 ENCOUNTER — Other Ambulatory Visit: Payer: Self-pay | Admitting: Obstetrics and Gynecology

## 2017-12-24 DIAGNOSIS — Z30433 Encounter for removal and reinsertion of intrauterine contraceptive device: Secondary | ICD-10-CM

## 2017-12-24 MED ORDER — ALPRAZOLAM 0.5 MG PO TABS: 0.5000 mg | ORAL_TABLET | Freq: Once | ORAL | 0 refills | Status: AC

## 2017-12-24 MED ORDER — MISOPROSTOL 200 MCG PO TABS: 400.0000 ug | ORAL_TABLET | Freq: Once | ORAL | 0 refills | Status: DC

## 2017-12-24 NOTE — Progress Notes (Signed)
Prescriptions sent to take before IUD insertion

## 2017-12-24 NOTE — Telephone Encounter (Signed)
Prescriptions sent to pharmacy for cytotec and xanax to take before the procedure. I asked her not to drive to or from the appointment.

## 2017-12-25 ENCOUNTER — Ambulatory Visit: Payer: BLUE CROSS/BLUE SHIELD | Admitting: Obstetrics & Gynecology

## 2018-01-01 ENCOUNTER — Ambulatory Visit: Payer: Medicaid Other | Admitting: Obstetrics and Gynecology

## 2018-01-29 ENCOUNTER — Ambulatory Visit: Payer: BLUE CROSS/BLUE SHIELD

## 2018-01-30 ENCOUNTER — Ambulatory Visit (INDEPENDENT_AMBULATORY_CARE_PROVIDER_SITE_OTHER): Payer: BLUE CROSS/BLUE SHIELD | Admitting: Obstetrics and Gynecology

## 2018-01-30 ENCOUNTER — Encounter: Payer: Self-pay | Admitting: Obstetrics and Gynecology

## 2018-01-30 VITALS — BP 112/70 | HR 64 | Ht 64.0 in | Wt 239.0 lb

## 2018-01-30 DIAGNOSIS — Z3009 Encounter for other general counseling and advice on contraception: Secondary | ICD-10-CM

## 2018-02-02 ENCOUNTER — Encounter
Admission: RE | Admit: 2018-02-02 | Discharge: 2018-02-02 | Disposition: A | Discharge status: Home / Self Care | Payer: BLUE CROSS/BLUE SHIELD | Source: Ambulatory Visit | Attending: Obstetrics and Gynecology | Admitting: Obstetrics and Gynecology

## 2018-02-02 ENCOUNTER — Encounter: Payer: Self-pay | Admitting: *Deleted

## 2018-02-02 ENCOUNTER — Other Ambulatory Visit: Payer: Self-pay

## 2018-02-02 HISTORY — DX: Gastro-esophageal reflux disease without esophagitis: K21.9

## 2018-02-02 NOTE — Patient Instructions (Addendum)
Your procedure is scheduled on: 02-05-18 THURSDAY Report to Same Day Surgery 2nd floor medical mall [facility](Medical Mall Entrance-take elevator on left to 2nd floor.  Check in with surgery information desk.) To find out your arrival time please call [phone] between 1PM - 3PM [facility]  Remember: Instructions that are not followed completely may result in serious medical risk, up to and including death, or upon the discretion of your surgeon and anesthesiologist your surgery may need to be rescheduled.    _x___ 1. Do not eat food after midnight the night before your procedure. You may drink clear liquids up to 2 hours before you are scheduled to arrive at the hospital for your procedure.  Do not drink clear liquids within 2 hours of your scheduled arrival to the hospital.  Clear liquids include  --Water or Apple juice without pulp  --Clear carbohydrate beverage such as ClearFast or Gatorade  --Black Coffee or Clear Tea (No milk, no creamers, do not add anything to the coffee or Tea   ____Ensure clear carbohydrate drink on the way to the hospital for bariatric patients  ____Ensure clear carbohydrate drink 3 hours before surgery for Dr Rutherford NailByrnett's patients if physician instructed.     __x__ 2. No Alcohol for 24 hours before or after surgery.   __x__3. No Smoking or e-cigarettes for 24 prior to surgery.  Do not use any chewable tobacco products for at least 6 hour prior to surgery   ____  4. Bring all medications with you on the day of surgery if instructed.    __x__ 5. Notify your doctor if there is any change in your medical condition     (cold, fever, infections).    x___6. On the morning of surgery brush your teeth with toothpaste and water.  You may rinse your mouth with mouth wash if you wish.  Do not swallow any toothpaste or mouthwash.   Do not wear jewelry, make-up, hairpins, clips or nail polish.  Do not wear lotions, powders, or perfumes. You may wear deodorant.  Do  not shave 48 hours prior to surgery. Men may shave face and neck.  Do not bring valuables to the hospital.    [facility] is not responsible for any belongings or valuables.               Contacts, dentures or bridgework may not be worn into surgery.  Leave your suitcase in the car. After surgery it may be brought to your room.  For patients admitted to the hospital, discharge time is determined by your treatment team.  _  Patients discharged the day of surgery will not be allowed to drive home.  You will need someone to drive you home and stay with you the night of your procedure.    Please read over the following fact sheets that you were given:   [facility] Pre[facility]   ____ Take anti-hypertensive listed below, cardiac, seizure, asthma, anti-reflux and psychiatric medicines. These include:  1. NONE  2.  3.  4.  5.  6.  ____Fleets enema or Magnesium Citrate as directed.   _x___ Use CHG Soap or sage wipes as directed on instruction sheet   ____ Use inhalers on the day of surgery and bring to hospital day of surgery  ____ Stop Metformin and Janumet 2 days prior to surgery.    ____ Take 1/2 of usual insulin dose the night before surgery and none on the morning surgery.   ____  Follow recommendations from Cardiologist, Pulmonologist or PCP regarding stopping Aspirin, Coumadin, Plavix ,Eliquis, Effient, or Pradaxa, and Pletal.  X____Stop Anti-inflammatories such as Advil, Aleve, Ibuprofen, Motrin, Naproxen, Naprosyn, Goodies powders or aspirin products NOW-OK to take Tylenol   _x___ Stop supplements until after surgery-STOP LIVER FOCUS SUPPLEMENT NOW-MAY RESUME AFTER SURGERY   ____ Bring C-Pap to the hospital.

## 2018-02-03 ENCOUNTER — Telehealth: Payer: Self-pay

## 2018-02-03 ENCOUNTER — Other Ambulatory Visit: Payer: Self-pay | Admitting: Obstetrics and Gynecology

## 2018-02-03 DIAGNOSIS — Z30011 Encounter for initial prescription of contraceptive pills: Secondary | ICD-10-CM

## 2018-02-03 MED ORDER — NORGESTIMATE-ETH ESTRADIOL 0.25-35 MG-MCG PO TABS: 1.0000 | ORAL_TABLET | Freq: Every day | ORAL | 11 refills | Status: DC

## 2018-02-03 NOTE — Telephone Encounter (Signed)
Pt needs Dr Jerene PitchSchuman to call her, she has questions about her surgery, she thinks she wants to cancel the surgery and just have a new IUD

## 2018-02-03 NOTE — Telephone Encounter (Signed)
Patient returning call to Dr. Jerene PitchSchuman.

## 2018-02-03 NOTE — Progress Notes (Signed)
Patient has decided she no longer wants a tubal ligation. She would like to still have her IUD removed in the OR because she did not tolerate that procedure before in the office. She would like to start an oral contraception pill. She has been refraining from intercourse for more than a week and I asked her to continue to do so until after her approaching period and after initiating her birth control pack after the 5-7th day of her period.   Adelene Idlerhristanna Derriana Oser MD [facility] 02/03/18 3:36 PM

## 2018-02-03 NOTE — Telephone Encounter (Signed)
Called back but she did not answer. If she cal back you can let me know and I can speak to her between patients.

## 2018-02-04 ENCOUNTER — Encounter
Admission: RE | Admit: 2018-02-04 | Discharge: 2018-02-04 | Disposition: A | Discharge status: Home / Self Care | Payer: BLUE CROSS/BLUE SHIELD | Source: Ambulatory Visit | Attending: Obstetrics and Gynecology | Admitting: Obstetrics and Gynecology

## 2018-02-04 DIAGNOSIS — Z01812 Encounter for preprocedural laboratory examination: Secondary | ICD-10-CM | POA: Insufficient documentation

## 2018-02-04 LAB — CBC
HEMATOCRIT: 33.8 % — AB (ref 35.0–47.0)
Hemoglobin: 11.2 g/dL — ABNORMAL LOW (ref 12.0–16.0)
MCH: 28.8 pg (ref 26.0–34.0)
MCHC: 33.2 g/dL (ref 32.0–36.0)
MCV: 86.8 fL (ref 80.0–100.0)
Platelets: 193 10*3/uL (ref 150–440)
RBC: 3.89 MIL/uL (ref 3.80–5.20)
RDW: 14.1 % (ref 11.5–14.5)
WBC: 5.2 10*3/uL (ref 3.6–11.0)

## 2018-02-04 LAB — BASIC METABOLIC PANEL
Anion gap: 5 (ref 5–15)
BUN: 15 mg/dL (ref 6–20)
CHLORIDE: 107 mmol/L (ref 98–111)
CO2: 28 mmol/L (ref 22–32)
Calcium: 8.6 mg/dL — ABNORMAL LOW (ref 8.9–10.3)
Creatinine, Ser: 0.8 mg/dL (ref 0.44–1.00)
GFR calc Af Amer: >60 mL/min (ref >60)
GFR calc non Af Amer: >60 mL/min (ref >60)
GLUCOSE: 92 mg/dL (ref 70–99)
POTASSIUM: 4.3 mmol/L (ref 3.5–5.1)
Sodium: 140 mmol/L (ref 135–145)

## 2018-02-04 LAB — TYPE AND SCREEN
ABO/RH(D): A POS
Antibody Screen: NEGATIVE

## 2018-02-04 NOTE — Telephone Encounter (Signed)
I think this is taken care of, I spoke to her yesterday.

## 2018-02-05 ENCOUNTER — Ambulatory Visit
Admission: RE | Admit: 2018-02-05 | Discharge: 2018-02-05 | Disposition: A | Discharge status: Home / Self Care | Payer: BLUE CROSS/BLUE SHIELD | Source: Ambulatory Visit | Attending: Obstetrics and Gynecology | Admitting: Obstetrics and Gynecology

## 2018-02-05 ENCOUNTER — Telehealth: Payer: Self-pay | Admitting: Obstetrics and Gynecology

## 2018-02-05 ENCOUNTER — Encounter: Payer: Self-pay | Admitting: Certified Registered"

## 2018-02-05 ENCOUNTER — Encounter
Admission: RE | Disposition: A | Discharge status: Home / Self Care | Payer: Self-pay | Source: Ambulatory Visit | Attending: Obstetrics and Gynecology

## 2018-02-05 ENCOUNTER — Encounter: Payer: Self-pay | Admitting: *Deleted

## 2018-02-05 ENCOUNTER — Other Ambulatory Visit: Payer: Self-pay

## 2018-02-05 DIAGNOSIS — Z5309 Procedure and treatment not carried out because of other contraindication: Secondary | ICD-10-CM | POA: Insufficient documentation

## 2018-02-05 DIAGNOSIS — Z302 Encounter for sterilization: Secondary | ICD-10-CM | POA: Insufficient documentation

## 2018-02-05 LAB — POCT PREGNANCY, URINE: PREG TEST UR: NEGATIVE

## 2018-02-05 SURGERY — REMOVAL, INTRAUTERINE DEVICE
Anesthesia: Choice

## 2018-02-05 MED ORDER — LACTATED RINGERS IV SOLN: INTRAVENOUS | Status: DC

## 2018-02-05 MED ORDER — FAMOTIDINE 20 MG PO TABS: 20.0000 mg | ORAL_TABLET | Freq: Once | ORAL | Status: DC

## 2018-02-05 SURGICAL SUPPLY — 21 items
BAG COUNTER SPONGE EZ (MISCELLANEOUS) ×2 IMPLANT
CANISTER SUC SOCK COL 7IN (MISCELLANEOUS) ×2 IMPLANT
CATH ROBINSON RED A/P 16FR (CATHETERS) ×2 IMPLANT
DEVICE MYOSURE LITE (MISCELLANEOUS) IMPLANT
DEVICE MYOSURE REACH (MISCELLANEOUS) IMPLANT
ELECT REM PT RETURN 9FT ADLT (ELECTROSURGICAL) ×2
ELECTRODE REM PT RTRN 9FT ADLT (ELECTROSURGICAL) ×1 IMPLANT
GLOVE BIO SURGEON STRL SZ 6.5 (GLOVE) ×2 IMPLANT
GLOVE BIOGEL PI IND STRL 6.5 (GLOVE) ×1 IMPLANT
GLOVE BIOGEL PI INDICATOR 6.5 (GLOVE) ×1
GOWN STRL REUS W/ TWL LRG LVL3 (GOWN DISPOSABLE) ×2 IMPLANT
GOWN STRL REUS W/TWL LRG LVL3 (GOWN DISPOSABLE) ×2
PACK DNC HYST (MISCELLANEOUS) ×2 IMPLANT
PAD OB MATERNITY 4.3X12.25 (PERSONAL CARE ITEMS) ×2 IMPLANT
PAD PREP 24X41 OB/GYN DISP (PERSONAL CARE ITEMS) ×2 IMPLANT
SOL .9 NS 3000ML IRR  AL (IV SOLUTION) ×1
SOL .9 NS 3000ML IRR UROMATIC (IV SOLUTION) ×1 IMPLANT
STRAP SAFETY 5IN WIDE (MISCELLANEOUS) ×2 IMPLANT
TOWEL OR 17X26 4PK STRL BLUE (TOWEL DISPOSABLE) ×2 IMPLANT
TUBING CONNECTING 10 (TUBING) ×2 IMPLANT
TUBING HYSTEROSCOPY DOLPHIN (MISCELLANEOUS) ×2 IMPLANT

## 2018-02-05 NOTE — Telephone Encounter (Signed)
Patient is calling stating she would suppose to have a procedure done with Dr. Jerene PitchSchuman and needs to reschedule. Could you look into this and notify patient?

## 2018-02-05 NOTE — Progress Notes (Signed)
Patient drank a latte at [employer] today. Dr Randa NgoPiscitello informed Dr Jerene PitchSchuman that the patient was not NPO since 1200 AM. Dr Jerene PitchSchuman canceled the procedure due to patient non compliance. Patient was directed to call Dr Ellie LunchSchuman's office to reschedule the procedure.

## 2018-02-06 ENCOUNTER — Encounter: Payer: Self-pay | Admitting: Obstetrics and Gynecology

## 2018-02-06 ENCOUNTER — Ambulatory Visit (INDEPENDENT_AMBULATORY_CARE_PROVIDER_SITE_OTHER): Payer: BLUE CROSS/BLUE SHIELD | Admitting: Obstetrics and Gynecology

## 2018-02-06 VITALS — BP 120/76 | HR 84 | Ht 64.0 in | Wt 240.0 lb

## 2018-02-06 DIAGNOSIS — T8332XD Displacement of intrauterine contraceptive device, subsequent encounter: Secondary | ICD-10-CM

## 2018-02-06 DIAGNOSIS — Z30433 Encounter for removal and reinsertion of intrauterine contraceptive device: Secondary | ICD-10-CM

## 2018-02-06 DIAGNOSIS — Z30430 Encounter for insertion of intrauterine contraceptive device: Secondary | ICD-10-CM

## 2018-02-06 MED ORDER — PARAGARD INTRAUTERINE COPPER IU IUD: INTRAUTERINE_SYSTEM | Freq: Once | INTRAUTERINE | Status: DC
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
## 2018-03-06 ENCOUNTER — Ambulatory Visit: Payer: Medicaid Other | Admitting: Obstetrics and Gynecology

## 2018-11-13 ENCOUNTER — Ambulatory Visit: Payer: Medicaid Other | Admitting: Obstetrics & Gynecology

## 2019-04-18 DIAGNOSIS — R87810 Cervical high risk human papillomavirus (HPV) DNA test positive: Secondary | ICD-10-CM

## 2019-04-18 HISTORY — DX: Cervical high risk human papillomavirus (HPV) DNA test positive: R87.810
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
## 2019-04-19 ENCOUNTER — Encounter: Payer: Self-pay | Admitting: Obstetrics and Gynecology

## 2019-04-19 ENCOUNTER — Other Ambulatory Visit (HOSPITAL_COMMUNITY)
Admission: RE | Admit: 2019-04-19 | Discharge: 2019-04-19 | Disposition: A | Discharge status: Home / Self Care | Payer: Medicaid Other | Source: Ambulatory Visit | Attending: Obstetrics and Gynecology | Admitting: Obstetrics and Gynecology

## 2019-04-19 ENCOUNTER — Ambulatory Visit (INDEPENDENT_AMBULATORY_CARE_PROVIDER_SITE_OTHER): Payer: No Typology Code available for payment source | Admitting: Obstetrics and Gynecology

## 2019-04-19 ENCOUNTER — Other Ambulatory Visit: Payer: Self-pay

## 2019-04-19 VITALS — BP 140/80 | Ht 65.0 in | Wt 230.0 lb

## 2019-04-19 DIAGNOSIS — Z1151 Encounter for screening for human papillomavirus (HPV): Secondary | ICD-10-CM

## 2019-04-19 DIAGNOSIS — Z113 Encounter for screening for infections with a predominantly sexual mode of transmission: Secondary | ICD-10-CM

## 2019-04-19 DIAGNOSIS — Z3041 Encounter for surveillance of contraceptive pills: Secondary | ICD-10-CM

## 2019-04-19 DIAGNOSIS — Z124 Encounter for screening for malignant neoplasm of cervix: Secondary | ICD-10-CM | POA: Insufficient documentation

## 2019-04-19 DIAGNOSIS — F419 Anxiety disorder, unspecified: Secondary | ICD-10-CM

## 2019-04-19 MED ORDER — LO LOESTRIN FE 1 MG-10 MCG / 10 MCG PO TABS: 1.0000 | ORAL_TABLET | Freq: Every day | ORAL | 0 refills | Status: DC

## 2019-04-19 MED ORDER — ALPRAZOLAM 0.5 MG PO TABS: 0.5000 mg | ORAL_TABLET | Freq: Every evening | ORAL | 0 refills | Status: DC | PRN

## 2019-04-19 NOTE — Patient Instructions (Signed)
I value your feedback and entrusting us with your care. If you get a [name] Woods patient survey, I would appreciate you taking the time to let us know about your experience today. Thank you! 

## 2019-04-22 LAB — CYTOLOGY - PAP
Chlamydia: NEGATIVE
Comment: NEGATIVE
Comment: NEGATIVE
Comment: NEGATIVE
Comment: NORMAL
Diagnosis: NEGATIVE
High risk HPV: POSITIVE — AB
Neisseria Gonorrhea: NEGATIVE
Trichomonas: NEGATIVE

## 2019-04-26 ENCOUNTER — Encounter: Payer: Self-pay | Admitting: Obstetrics and Gynecology

## 2019-04-29 ENCOUNTER — Ambulatory Visit: Payer: Medicaid Other | Admitting: Obstetrics and Gynecology

## 2019-05-05 ENCOUNTER — Ambulatory Visit: Payer: Medicaid Other | Admitting: Obstetrics and Gynecology
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
## 2019-05-31 ENCOUNTER — Other Ambulatory Visit: Payer: Self-pay

## 2019-05-31 ENCOUNTER — Ambulatory Visit (INDEPENDENT_AMBULATORY_CARE_PROVIDER_SITE_OTHER): Payer: No Typology Code available for payment source | Admitting: Obstetrics and Gynecology

## 2019-05-31 ENCOUNTER — Encounter: Payer: Self-pay | Admitting: Obstetrics and Gynecology

## 2019-05-31 VITALS — BP 130/80 | Ht 65.0 in | Wt 225.0 lb

## 2019-05-31 DIAGNOSIS — F419 Anxiety disorder, unspecified: Secondary | ICD-10-CM

## 2019-05-31 DIAGNOSIS — Z3041 Encounter for surveillance of contraceptive pills: Secondary | ICD-10-CM

## 2019-05-31 DIAGNOSIS — Z01419 Encounter for gynecological examination (general) (routine) without abnormal findings: Secondary | ICD-10-CM

## 2019-05-31 DIAGNOSIS — Z23 Encounter for immunization: Secondary | ICD-10-CM

## 2019-05-31 MED ORDER — LO LOESTRIN FE 1 MG-10 MCG / 10 MCG PO TABS: 1.0000 | ORAL_TABLET | Freq: Every day | ORAL | 3 refills | Status: DC

## 2019-05-31 NOTE — Patient Instructions (Signed)
I value your feedback and entrusting us with your care. If you get a [facility] patient survey, I would appreciate you taking the time to let us know about your experience today. Thank you!  As of May 27, 2019, your lab results will be released to your MyChart immediately, before I even have a chance to see them. Please give me time to review them and contact you if there are any abnormalities. Thank you for your patience.  

## 2019-08-27 ENCOUNTER — Other Ambulatory Visit: Payer: Self-pay

## 2019-08-27 ENCOUNTER — Ambulatory Visit: Payer: Medicaid Other | Attending: Internal Medicine

## 2019-08-27 DIAGNOSIS — Z23 Encounter for immunization: Secondary | ICD-10-CM

## 2019-08-27 NOTE — Progress Notes (Signed)
   Covid-19 Vaccination Clinic  Name:  [name] Woods    MRN: [id number] DOB: 08-11-1985  08/27/2019  [name] was observed post Covid-19 immunization for 15 minutes without incident. She was provided with Vaccine Information Sheet and instruction to access the V-Safe system.   [name] was instructed to call 911 with any severe reactions post vaccine: Marland Kitchen Difficulty breathing  . Swelling of face and throat  . A fast heartbeat  . A bad rash all over body  . Dizziness and weakness   Immunizations Administered    Name Date Dose VIS Date Route   Pfizer COVID-19 Vaccine 08/27/2019  8:59 AM 0.3 mL 05/28/2019 Intramuscular   Manufacturer: [employer], [employer]   Lot: [id number]   NDC: [address]-1

## 2019-09-21 ENCOUNTER — Ambulatory Visit: Payer: Medicaid Other | Attending: Internal Medicine

## 2019-09-21 DIAGNOSIS — Z23 Encounter for immunization: Secondary | ICD-10-CM

## 2019-09-21 NOTE — Progress Notes (Signed)
   Covid-19 Vaccination Clinic  Name:  [name] Woods    MRN: [id number] DOB: [birth year]  09/21/2019  [name] Woods was observed post Covid-19 immunization for 15 minutes without incident. She was provided with Vaccine Information Sheet and instruction to access the V-Safe system.   [name] Woods was instructed to call 911 with any severe reactions post vaccine: Marland Kitchen Difficulty breathing  . Swelling of face and throat  . A fast heartbeat  . A bad rash all over body  . Dizziness and weakness   Immunizations Administered    Name Date Dose VIS Date Route   Pfizer COVID-19 Vaccine 09/21/2019  9:11 AM 0.3 mL 05/28/2019 Intramuscular   Manufacturer: [employer], [employer]   Lot: [id number]   NDC: [address]-4

## 2020-02-04 ENCOUNTER — Other Ambulatory Visit: Payer: Self-pay

## 2020-06-05 ENCOUNTER — Other Ambulatory Visit (HOSPITAL_COMMUNITY)
Admission: RE | Admit: 2020-06-05 | Discharge: 2020-06-05 | Disposition: A | Discharge status: Home / Self Care | Payer: Medicaid Other | Source: Ambulatory Visit | Attending: Obstetrics and Gynecology | Admitting: Obstetrics and Gynecology

## 2020-06-05 ENCOUNTER — Other Ambulatory Visit: Payer: Self-pay

## 2020-06-05 ENCOUNTER — Ambulatory Visit (INDEPENDENT_AMBULATORY_CARE_PROVIDER_SITE_OTHER): Payer: Medicaid Other | Admitting: Obstetrics and Gynecology

## 2020-06-05 ENCOUNTER — Encounter: Payer: Self-pay | Admitting: Obstetrics and Gynecology

## 2020-06-05 VITALS — BP 120/60 | Ht 65.0 in | Wt 226.0 lb

## 2020-06-05 DIAGNOSIS — Z01419 Encounter for gynecological examination (general) (routine) without abnormal findings: Secondary | ICD-10-CM | POA: Insufficient documentation

## 2020-06-05 DIAGNOSIS — Z124 Encounter for screening for malignant neoplasm of cervix: Secondary | ICD-10-CM | POA: Insufficient documentation

## 2020-06-05 DIAGNOSIS — Z3041 Encounter for surveillance of contraceptive pills: Secondary | ICD-10-CM

## 2020-06-05 DIAGNOSIS — R8290 Unspecified abnormal findings in urine: Secondary | ICD-10-CM | POA: Diagnosis not present

## 2020-06-05 DIAGNOSIS — Z113 Encounter for screening for infections with a predominantly sexual mode of transmission: Secondary | ICD-10-CM | POA: Diagnosis not present

## 2020-06-05 DIAGNOSIS — Z1151 Encounter for screening for human papillomavirus (HPV): Secondary | ICD-10-CM | POA: Insufficient documentation

## 2020-06-05 DIAGNOSIS — R87810 Cervical high risk human papillomavirus (HPV) DNA test positive: Secondary | ICD-10-CM

## 2020-06-05 DIAGNOSIS — F419 Anxiety disorder, unspecified: Secondary | ICD-10-CM

## 2020-06-05 LAB — POCT URINALYSIS DIPSTICK
Bilirubin, UA: NEGATIVE
Glucose, UA: NEGATIVE
Ketones, UA: NEGATIVE
Leukocytes, UA: NEGATIVE
Nitrite, UA: NEGATIVE
Protein, UA: NEGATIVE
Spec Grav, UA: 1.015 (ref 1.010–1.025)
pH, UA: 6 (ref 5.0–8.0)

## 2020-06-05 MED ORDER — ALPRAZOLAM 0.5 MG PO TABS: 0.5000 mg | ORAL_TABLET | Freq: Every evening | ORAL | 0 refills | Status: DC | PRN

## 2020-06-05 MED ORDER — LO LOESTRIN FE 1 MG-10 MCG / 10 MCG PO TABS: 1.0000 | ORAL_TABLET | Freq: Every day | ORAL | 3 refills | Status: DC
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
## 2020-06-05 NOTE — Patient Instructions (Signed)
I value your feedback and entrusting us with your care. If you get a [facility] patient survey, I would appreciate you taking the time to let us know about your experience today. Thank you!  As of May 27, 2019, your lab results will be released to your MyChart immediately, before I even have a chance to see them. Please give me time to review them and contact you if there are any abnormalities. Thank you for your patience.  

## 2020-06-20 LAB — CYTOLOGY - PAP
Chlamydia: NEGATIVE
Comment: NEGATIVE
Comment: NEGATIVE
Comment: NORMAL
Diagnosis: NEGATIVE
Diagnosis: REACTIVE
High risk HPV: POSITIVE — AB
Neisseria Gonorrhea: NEGATIVE

## 2020-06-21 ENCOUNTER — Telehealth: Payer: Self-pay | Admitting: Obstetrics and Gynecology

## 2020-06-21 NOTE — Telephone Encounter (Signed)
Patient is calling back to follow up on results. Please advise

## 2020-06-22 NOTE — Telephone Encounter (Signed)
Done on dup msg

## 2020-07-07 ENCOUNTER — Other Ambulatory Visit: Payer: Self-pay

## 2020-07-07 ENCOUNTER — Encounter: Payer: Self-pay | Admitting: Obstetrics and Gynecology

## 2020-07-07 ENCOUNTER — Other Ambulatory Visit (HOSPITAL_COMMUNITY)
Admission: RE | Admit: 2020-07-07 | Discharge: 2020-07-07 | Disposition: A | Discharge status: Home / Self Care | Payer: Medicaid Other | Source: Ambulatory Visit | Attending: Obstetrics and Gynecology | Admitting: Obstetrics and Gynecology

## 2020-07-07 ENCOUNTER — Ambulatory Visit: Payer: Medicaid Other | Admitting: Obstetrics and Gynecology

## 2020-07-07 ENCOUNTER — Ambulatory Visit (INDEPENDENT_AMBULATORY_CARE_PROVIDER_SITE_OTHER): Payer: BC Managed Care – PPO | Admitting: Obstetrics and Gynecology

## 2020-07-07 VITALS — Wt 221.0 lb

## 2020-07-07 DIAGNOSIS — Z23 Encounter for immunization: Secondary | ICD-10-CM | POA: Diagnosis not present

## 2020-07-07 DIAGNOSIS — R87612 Low grade squamous intraepithelial lesion on cytologic smear of cervix (LGSIL): Secondary | ICD-10-CM | POA: Diagnosis not present

## 2020-07-07 DIAGNOSIS — R87810 Cervical high risk human papillomavirus (HPV) DNA test positive: Secondary | ICD-10-CM | POA: Diagnosis present

## 2020-07-07 NOTE — Patient Instructions (Signed)

## 2020-07-07 NOTE — Addendum Note (Signed)
Addended by: Clement Husbands A on: 07/07/2020 04:37 PM   Modules accepted: Orders

## 2020-07-07 NOTE — Progress Notes (Signed)
   GYNECOLOGY CLINIC COLPOSCOPY PROCEDURE NOTE  35 y.o. [id number] here for colposcopy for NIL and HR HPV+  pap smear on 06/05/2020. Discussed underlying role for HPV infection in the development of cervical dysplasia, its natural history and progression/regression, need for surveillance.  Is the patient  pregnant: No LMP: No LMP recorded. Smoking status:  reports that she has never smoked. She has never used smokeless tobacco. Contraception: OCP (estrogen/progesterone) Future fertility desired:  Yes  Patient given informed consent, signed copy in the chart, time out was performed.  The patient was position in dorsal lithotomy position. Speculum was placed the cervix was visualized.   After application of acetic acid colposcopic inspection of the cervix was undertaken.   Colposcopy adequate, full visualization of transformation zone: Yes acetowhite lesion(s) noted at 12 o'clock; corresponding biopsies obtained.   ECC specimen obtained:  Yes  All specimens were labeled and sent to pathology.   Patient was given post procedure instructions.  Will follow up pathology and manage accordingly.  Routine preventative health maintenance measures emphasized.  Physical Exam Genitourinary:        Adelene Idler MD [facility], [facility] 07/07/2020 3:27 PM

## 2020-07-12 LAB — SURGICAL PATHOLOGY

## 2020-09-04 ENCOUNTER — Other Ambulatory Visit: Payer: Self-pay

## 2020-09-04 ENCOUNTER — Ambulatory Visit (INDEPENDENT_AMBULATORY_CARE_PROVIDER_SITE_OTHER): Payer: BC Managed Care – PPO

## 2020-09-04 DIAGNOSIS — Z23 Encounter for immunization: Secondary | ICD-10-CM | POA: Diagnosis not present

## 2020-09-04 NOTE — Progress Notes (Signed)
Patient presents today for Gardasil #2. LMP 08/29/20 . Given IM Right Deltoid. Patient tolerated well.

## 2021-01-04 ENCOUNTER — Ambulatory Visit: Payer: BC Managed Care – PPO

## 2021-11-19 ENCOUNTER — Other Ambulatory Visit: Payer: Medicaid Other

## 2021-11-27 ENCOUNTER — Ambulatory Visit (LOCAL_COMMUNITY_HEALTH_CENTER): Payer: Self-pay

## 2021-11-27 DIAGNOSIS — Z111 Encounter for screening for respiratory tuberculosis: Secondary | ICD-10-CM

## 2021-11-30 ENCOUNTER — Ambulatory Visit (LOCAL_COMMUNITY_HEALTH_CENTER): Payer: Self-pay

## 2021-11-30 DIAGNOSIS — Z111 Encounter for screening for respiratory tuberculosis: Secondary | ICD-10-CM

## 2021-11-30 LAB — TB SKIN TEST
Induration: 0 mm
TB Skin Test: NEGATIVE

## 2022-02-11 ENCOUNTER — Ambulatory Visit: Payer: BC Managed Care – PPO | Admitting: Obstetrics & Gynecology

## 2022-02-12 ENCOUNTER — Ambulatory Visit: Payer: BC Managed Care – PPO | Admitting: Obstetrics & Gynecology

## 2022-02-19 ENCOUNTER — Ambulatory Visit (INDEPENDENT_AMBULATORY_CARE_PROVIDER_SITE_OTHER): Payer: Medicaid Other | Admitting: Obstetrics & Gynecology

## 2022-02-19 ENCOUNTER — Other Ambulatory Visit (HOSPITAL_COMMUNITY)
Admission: RE | Admit: 2022-02-19 | Discharge: 2022-02-19 | Disposition: A | Discharge status: Home / Self Care | Payer: Medicaid Other | Source: Ambulatory Visit | Attending: Obstetrics & Gynecology | Admitting: Obstetrics & Gynecology

## 2022-02-19 ENCOUNTER — Encounter: Payer: Self-pay | Admitting: Obstetrics & Gynecology

## 2022-02-19 VITALS — BP 120/80 | Ht 65.0 in | Wt 212.0 lb

## 2022-02-19 DIAGNOSIS — Z113 Encounter for screening for infections with a predominantly sexual mode of transmission: Secondary | ICD-10-CM

## 2022-02-19 DIAGNOSIS — Z124 Encounter for screening for malignant neoplasm of cervix: Secondary | ICD-10-CM | POA: Diagnosis present

## 2022-02-19 NOTE — Progress Notes (Signed)
Subjective:     [name] Woods is a 36 y.o. female here for a routine exam.  Current complaints: wants BTL, very sure she is finished with child bearing.  Personal health questionnaire reviewed: yes.   Gynecologic History Patient's last menstrual period was 02/13/2022. Contraception: none and pt wants BTL Last Pap: not documented. Results were: normal  Obstetric History OB History  Gravida Para Term Preterm AB Living  3 2 2   1 2   SAB IAB Ectopic Multiple Live Births  1       2    # Outcome Date GA Lbr Len/2nd Weight Sex Delivery Anes PTL Lv  3 Term 11/19/16 [redacted]w[redacted]d / 00:08 8 lb 0.8 oz (3.65 kg) F Vag-Spont None  LIV  2 SAB 06/17/14          1 Term 02/21/13 [redacted]w[redacted]d  7 lb 2 oz (3.232 kg) M Vag-Spont   LIV     The following portions of the patient's history were reviewed and updated as appropriate: allergies, current medications, past family history, past medical history, past social history, past surgical history, and problem list.  Review of Systems A comprehensive review of systems was negative.    Objective:    BP 120/80   Ht 5\' 5"  (1.651 m)   Wt 212 lb (96.2 kg)   LMP 02/13/2022   BMI 35.28 kg/m  General appearance: alert, cooperative, and no distress Breasts: normal appearance, no masses or tenderness, Inspection negative Abdomen: normal findings: no masses palpable, no organomegaly, and soft, non-tender Pelvic: cervix normal in appearance, external genitalia normal, no adnexal masses or tenderness, no cervical motion tenderness, uterus normal size, shape, and consistency, and vagina normal without discharge Extremities: extremities normal, atraumatic, no cyanosis or edema    Assessment:    Healthy female exam.   Desiring BTL Plan:    Follow up in: 3 months. With Dr , Pt wants tubal sterilization

## 2022-02-21 LAB — CERVICOVAGINAL ANCILLARY ONLY
Bacterial Vaginitis (gardnerella): POSITIVE — AB
Candida Glabrata: NEGATIVE
Candida Vaginitis: NEGATIVE
Chlamydia: NEGATIVE
Comment: NEGATIVE
Comment: NEGATIVE
Comment: NEGATIVE
Comment: NEGATIVE
Comment: NEGATIVE
Comment: NORMAL
Neisseria Gonorrhea: NEGATIVE
Trichomonas: NEGATIVE

## 2022-02-22 ENCOUNTER — Telehealth: Payer: Self-pay

## 2022-02-22 ENCOUNTER — Other Ambulatory Visit: Payer: Self-pay

## 2022-02-22 DIAGNOSIS — N760 Acute vaginitis: Secondary | ICD-10-CM

## 2022-02-22 MED ORDER — METRONIDAZOLE 500 MG PO TABS: 500.0000 mg | ORAL_TABLET | Freq: Two times a day (BID) | ORAL | 0 refills | Status: DC

## 2022-02-22 NOTE — Telephone Encounter (Signed)
Voicemail not set up yet. Will send message in my chart.

## 2022-02-22 NOTE — Telephone Encounter (Signed)
Patient returned missed call. Notified of Bacterial vaginitis. Rx for Flagyl has been sent to pharmacy. She is aware to abstain from alcohol and intercourse during the course of treatment.
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
## 2022-04-09 ENCOUNTER — Other Ambulatory Visit (INDEPENDENT_AMBULATORY_CARE_PROVIDER_SITE_OTHER): Payer: BC Managed Care – PPO | Admitting: Obstetrics and Gynecology

## 2022-04-09 ENCOUNTER — Ambulatory Visit (INDEPENDENT_AMBULATORY_CARE_PROVIDER_SITE_OTHER): Payer: BC Managed Care – PPO | Admitting: Obstetrics and Gynecology

## 2022-04-09 ENCOUNTER — Encounter: Payer: Self-pay | Admitting: Obstetrics and Gynecology

## 2022-04-09 VITALS — BP 106/81 | HR 74 | Resp 16 | Ht 64.0 in | Wt 214.1 lb

## 2022-04-09 DIAGNOSIS — Z01812 Encounter for preprocedural laboratory examination: Secondary | ICD-10-CM

## 2022-04-09 DIAGNOSIS — Z01818 Encounter for other preprocedural examination: Secondary | ICD-10-CM

## 2022-04-09 DIAGNOSIS — Z3009 Encounter for other general counseling and advice on contraception: Secondary | ICD-10-CM

## 2022-04-09 NOTE — Patient Instructions (Signed)
GYNECOLOGY PRE-OPERATIVE INSTRUCTIONS  You are scheduled for surgery on 11//13/2023.  The name of your procedure is: Laparoscopic Tubal Ligation.   Please read through these instructions carefully regarding preparation for your surgery: Nothing to eat after midnight on the day prior to surgery.  Do not take any medications unless recommended by your provider on day prior to surgery.  Do not take NSAIDs (Motrin, Aleve) or aspirin 7 days prior to surgery.  You may take Tylenol products for minor aches and pains.  You will receive a prescription for pain medications post-operatively.  You will be contacted by phone approximately 1-2 weeks prior to surgery to schedule your pre-operative appointment.  Please call the office if you have any questions regarding your upcoming surgery.    Thank you for choosing [facility].

## 2022-04-16 ENCOUNTER — Other Ambulatory Visit: Payer: Self-pay | Admitting: Obstetrics and Gynecology

## 2022-04-16 MED ORDER — NORETHIN ACE-ETH ESTRAD-FE 1-20 MG-MCG PO TABS: 1.0000 | ORAL_TABLET | Freq: Every day | ORAL | 0 refills | Status: DC

## 2022-04-16 NOTE — Progress Notes (Signed)
Prescription for birth control pills for 3 months until surgery is scheduled was sent in to patient's preferred pharmacy, [facility] in [place].   Dr. Marcelline Mates

## 2022-04-17 ENCOUNTER — Telehealth: Payer: Self-pay | Admitting: Obstetrics and Gynecology

## 2022-04-17 NOTE — Telephone Encounter (Signed)
-----   Message from [name], MD sent at 04/16/2022  4:48 PM EDT ----- I believe I already have a surgery scheduled on the 15th, and am trying to keep most surgeries on Mondays as our call schedule has been changing lately and don't want to keep scheduling on Fridays when I may or may not be in the office or off. She is welcome to have a Monday of either of those weeks.   ----- Message ----- From: Audree Camel Sent: 04/16/2022  10:42 AM EDT To: Judson Roch, MD; #  Patient is requesting the week on 12/15 or 12/22 would work better for patient for rescheduling her surgery. Patient wants to follow up on if birth control can be called in.

## 2022-04-17 NOTE — Telephone Encounter (Signed)
I contacted patient via phone. I advised patient to call back to speak with her about Monday surgery.

## 2022-04-17 NOTE — Telephone Encounter (Signed)
That's fine. We can move her to that date.

## 2022-04-17 NOTE — Telephone Encounter (Signed)
I spoke with patient [name] Woods phone and patient is requesting 06/03/22 for surgery date if possible

## 2022-04-19 ENCOUNTER — Inpatient Hospital Stay: Admission: RE | Admit: 2022-04-19 | Payer: BC Managed Care – PPO | Source: Ambulatory Visit

## 2022-04-22 ENCOUNTER — Other Ambulatory Visit: Payer: BC Managed Care – PPO

## 2022-05-08 ENCOUNTER — Encounter: Payer: BC Managed Care – PPO | Admitting: Obstetrics and Gynecology

## 2022-05-23 ENCOUNTER — Other Ambulatory Visit: Payer: Self-pay

## 2022-05-23 ENCOUNTER — Encounter
Admission: RE | Admit: 2022-05-23 | Discharge: 2022-05-23 | Disposition: A | Discharge status: Home / Self Care | Payer: BC Managed Care – PPO | Source: Ambulatory Visit | Attending: Obstetrics and Gynecology | Admitting: Obstetrics and Gynecology

## 2022-05-23 DIAGNOSIS — Z01812 Encounter for preprocedural laboratory examination: Secondary | ICD-10-CM

## 2022-05-23 NOTE — Patient Instructions (Addendum)
Your procedure is scheduled on: 06/03/22 - Monday Report to the Registration Desk on the 1st floor of the [facility]. To find out your arrival time, please call [phone] between 1PM - 3PM on: 05/31/22 - Friday If your arrival time is 6:00 am, do not arrive prior to that time as the [facility] entrance doors do not open until 6:00 am.  REMEMBER: Instructions that are not followed completely may result in serious medical risk, up to and including death; or upon the discretion of your surgeon and anesthesiologist your surgery may need to be rescheduled.  Do not eat food or drink any fluids after midnight the night before surgery.  No gum chewing, lozengers or hard candies.   TAKE THESE MEDICATIONS THE MORNING OF SURGERY WITH A SIP OF WATER: NONE   One week prior to surgery: Stop Anti-inflammatories (NSAIDS) such as Advil, Aleve, Ibuprofen, Motrin, Naproxen, Naprosyn and Aspirin based products such as Excedrin, Goodys Powder, BC Powder.  Stop ANY OVER THE COUNTER supplements until after surgery.  You may however, continue to take Tylenol if needed for pain up until the day of surgery.  No Alcohol for 24 hours before or after surgery.  No Smoking including e-cigarettes for 24 hours prior to surgery.  No chewable tobacco products for at least 6 hours prior to surgery.  No nicotine patches on the day of surgery.  Do not use any "recreational" drugs for at least a week prior to your surgery.  Please be advised that the combination of cocaine and anesthesia may have negative outcomes, up to and including death. If you test positive for cocaine, your surgery will be cancelled.  On the morning of surgery brush your teeth with toothpaste and water, you may rinse your mouth with mouthwash if you wish. Do not swallow any toothpaste or mouthwash.  Use CHG Soap or wipes as directed on instruction sheet.  Do not wear jewelry, make-up, hairpins, clips or nail polish.  Do not wear  lotions, powders, or perfumes.   Do not shave body from the neck down 48 hours prior to surgery just in case you cut yourself which could leave a site for infection.  Also, freshly shaved skin may become irritated if using the CHG soap.  Contact lenses, hearing aids and dentures may not be worn into surgery.  Do not bring valuables to the hospital. [facility] is not responsible for any missing/lost belongings or valuables.   Notify your doctor if there is any change in your medical condition (cold, fever, infection).  Wear comfortable clothing (specific to your surgery type) to the hospital.  After surgery, you can help prevent lung complications by doing breathing exercises.  Take deep breaths and cough every 1-2 hours. Your doctor may order a device called an Incentive Spirometer to help you take deep breaths. When coughing or sneezing, hold a pillow firmly against your incision with both hands. This is called "splinting." Doing this helps protect your incision. It also decreases belly discomfort.  If you are being admitted to the hospital overnight, leave your suitcase in the car. After surgery it may be brought to your room.  If you are being discharged the day of surgery, you will not be allowed to drive home. You will need a responsible adult (18 years or older) to drive you home and stay with you that night.   If you are taking public transportation, you will need to have a responsible adult (18 years or older) with you.  Please confirm with your physician that it is acceptable to use public transportation.   Please call the Pre-admissions Testing Dept. at [phone] if you have any questions about these instructions.  Surgery Visitation Policy:  Patients undergoing a surgery or procedure may have two family members or support persons with them as long as the person is not COVID-19 positive or experiencing its symptoms.   Inpatient Visitation:    Visiting hours are 7 a.m.  to 8 p.m. Up to four visitors are allowed at one time in a patient room. The visitors may rotate out with other people during the day. One designated support person (adult) may remain overnight.  Due to an increase in RSV and influenza rates and associated hospitalizations, children ages 31 and under will not be able to visit patients in [facility]. Masks continue to be strongly recommended.

## 2022-05-24 ENCOUNTER — Inpatient Hospital Stay: Admission: RE | Admit: 2022-05-24 | Payer: BC Managed Care – PPO | Source: Ambulatory Visit

## 2022-05-30 ENCOUNTER — Encounter: Payer: Self-pay | Admitting: Urgent Care

## 2022-05-30 ENCOUNTER — Encounter
Admission: RE | Admit: 2022-05-30 | Discharge: 2022-05-30 | Disposition: A | Discharge status: Home / Self Care | Payer: BC Managed Care – PPO | Source: Ambulatory Visit | Attending: Obstetrics and Gynecology | Admitting: Obstetrics and Gynecology

## 2022-05-30 DIAGNOSIS — Z01812 Encounter for preprocedural laboratory examination: Secondary | ICD-10-CM | POA: Diagnosis present

## 2022-05-30 LAB — CBC
HCT: 39.9 % (ref 36.0–46.0)
Hemoglobin: 12.7 g/dL (ref 12.0–15.0)
MCH: 29.3 pg (ref 26.0–34.0)
MCHC: 31.8 g/dL (ref 30.0–36.0)
MCV: 91.9 fL (ref 80.0–100.0)
Platelets: 213 10*3/uL (ref 150–400)
RBC: 4.34 MIL/uL (ref 3.87–5.11)
RDW: 12.5 % (ref 11.5–15.5)
WBC: 3.5 10*3/uL — ABNORMAL LOW (ref 4.0–10.5)
nRBC: 0 % (ref 0.0–0.2)

## 2022-06-03 ENCOUNTER — Ambulatory Visit: Payer: BC Managed Care – PPO | Admitting: Certified Registered Nurse Anesthetist

## 2022-06-03 ENCOUNTER — Ambulatory Visit
Admission: RE | Admit: 2022-06-03 | Discharge: 2022-06-03 | Disposition: A | Discharge status: Home / Self Care | Payer: BC Managed Care – PPO | Attending: Obstetrics and Gynecology | Admitting: Obstetrics and Gynecology

## 2022-06-03 ENCOUNTER — Other Ambulatory Visit: Payer: Self-pay

## 2022-06-03 ENCOUNTER — Encounter: Payer: Self-pay | Admitting: Obstetrics and Gynecology

## 2022-06-03 ENCOUNTER — Encounter
Admission: RE | Disposition: A | Discharge status: Home / Self Care | Payer: Self-pay | Source: Home / Self Care | Attending: Obstetrics and Gynecology

## 2022-06-03 DIAGNOSIS — Z302 Encounter for sterilization: Secondary | ICD-10-CM | POA: Insufficient documentation

## 2022-06-03 DIAGNOSIS — Z01812 Encounter for preprocedural laboratory examination: Secondary | ICD-10-CM

## 2022-06-03 HISTORY — PX: LAPAROSCOPIC TUBAL LIGATION: SHX1937

## 2022-06-03 LAB — POCT PREGNANCY, URINE: Preg Test, Ur: NEGATIVE

## 2022-06-03 SURGERY — LIGATION, FALLOPIAN TUBE, LAPAROSCOPIC
Anesthesia: General | Laterality: Bilateral

## 2022-06-03 MED ORDER — BUPIVACAINE HCL (PF) 0.5 % IJ SOLN
INTRAMUSCULAR | Status: AC
  Filled 2022-06-03: qty 30

## 2022-06-03 MED ORDER — CHLORHEXIDINE GLUCONATE 0.12 % MT SOLN: 15.0000 mL | Freq: Once | OROMUCOSAL | Status: AC

## 2022-06-03 MED ORDER — FAMOTIDINE 20 MG PO TABS
ORAL_TABLET | ORAL | Status: AC
  Administered 2022-06-03: 20 mg via ORAL
  Filled 2022-06-03: qty 1

## 2022-06-03 MED ORDER — 0.9 % SODIUM CHLORIDE (POUR BTL) OPTIME
TOPICAL | Status: DC | PRN
  Administered 2022-06-03: 150 mL

## 2022-06-03 MED ORDER — DEXAMETHASONE SODIUM PHOSPHATE 10 MG/ML IJ SOLN
INTRAMUSCULAR | Status: AC
  Filled 2022-06-03: qty 1

## 2022-06-03 MED ORDER — SUGAMMADEX SODIUM 200 MG/2ML IV SOLN
INTRAVENOUS | Status: DC | PRN
  Administered 2022-06-03: 200 mg via INTRAVENOUS

## 2022-06-03 MED ORDER — MIDAZOLAM HCL 2 MG/2ML IJ SOLN
INTRAMUSCULAR | Status: AC
  Filled 2022-06-03: qty 2

## 2022-06-03 MED ORDER — MIDAZOLAM HCL 2 MG/2ML IJ SOLN
INTRAMUSCULAR | Status: DC | PRN
  Administered 2022-06-03: 2 mg via INTRAVENOUS

## 2022-06-03 MED ORDER — PROPOFOL 10 MG/ML IV BOLUS
INTRAVENOUS | Status: DC | PRN
  Administered 2022-06-03: 200 mg via INTRAVENOUS

## 2022-06-03 MED ORDER — ONDANSETRON HCL 4 MG/2ML IJ SOLN
INTRAMUSCULAR | Status: DC | PRN
  Administered 2022-06-03: 4 mg via INTRAVENOUS

## 2022-06-03 MED ORDER — ACETAMINOPHEN 500 MG PO TABS
1000.0000 mg | ORAL_TABLET | Freq: Once | ORAL | Status: AC
  Administered 2022-06-03: 1000 mg via ORAL

## 2022-06-03 MED ORDER — ORAL CARE MOUTH RINSE: 15.0000 mL | Freq: Once | OROMUCOSAL | Status: AC

## 2022-06-03 MED ORDER — ACETAMINOPHEN 500 MG PO TABS
ORAL_TABLET | ORAL | Status: AC
  Administered 2022-06-03: 1000 mg via ORAL
  Filled 2022-06-03: qty 2

## 2022-06-03 MED ORDER — FAMOTIDINE 20 MG PO TABS: 20.0000 mg | ORAL_TABLET | Freq: Once | ORAL | Status: AC

## 2022-06-03 MED ORDER — LACTATED RINGERS IV SOLN: INTRAVENOUS | Status: DC

## 2022-06-03 MED ORDER — DEXAMETHASONE SODIUM PHOSPHATE 10 MG/ML IJ SOLN
INTRAMUSCULAR | Status: DC | PRN
  Administered 2022-06-03: 10 mg via INTRAVENOUS

## 2022-06-03 MED ORDER — CHLORHEXIDINE GLUCONATE 0.12 % MT SOLN
OROMUCOSAL | Status: AC
  Administered 2022-06-03: 15 mL via OROMUCOSAL
  Filled 2022-06-03: qty 15

## 2022-06-03 MED ORDER — OXYCODONE HCL 5 MG PO TABS: 5.0000 mg | ORAL_TABLET | Freq: Three times a day (TID) | ORAL | 0 refills | Status: DC | PRN

## 2022-06-03 MED ORDER — FENTANYL CITRATE (PF) 100 MCG/2ML IJ SOLN
INTRAMUSCULAR | Status: DC | PRN
  Administered 2022-06-03 (×2): 50 ug via INTRAVENOUS

## 2022-06-03 MED ORDER — ROCURONIUM BROMIDE 100 MG/10ML IV SOLN
INTRAVENOUS | Status: DC | PRN
  Administered 2022-06-03: 50 mg via INTRAVENOUS

## 2022-06-03 MED ORDER — BUPIVACAINE HCL 0.5 % IJ SOLN
INTRAMUSCULAR | Status: DC | PRN
  Administered 2022-06-03: 10 mL

## 2022-06-03 MED ORDER — OXYCODONE HCL 5 MG PO TABS
5.0000 mg | ORAL_TABLET | Freq: Once | ORAL | Status: AC | PRN
  Administered 2022-06-03: 5 mg via ORAL

## 2022-06-03 MED ORDER — GABAPENTIN 300 MG PO CAPS: 300.0000 mg | ORAL_CAPSULE | ORAL | Status: AC

## 2022-06-03 MED ORDER — OXYCODONE HCL 5 MG PO TABS
ORAL_TABLET | ORAL | Status: AC
  Filled 2022-06-03: qty 1

## 2022-06-03 MED ORDER — CELECOXIB 200 MG PO CAPS: 400.0000 mg | ORAL_CAPSULE | ORAL | Status: AC

## 2022-06-03 MED ORDER — OXYCODONE HCL 5 MG/5ML PO SOLN: 5.0000 mg | Freq: Once | ORAL | Status: AC | PRN

## 2022-06-03 MED ORDER — HYDROMORPHONE HCL 1 MG/ML IJ SOLN
INTRAMUSCULAR | Status: AC
  Filled 2022-06-03: qty 1

## 2022-06-03 MED ORDER — ACETAMINOPHEN 500 MG PO TABS
ORAL_TABLET | ORAL | Status: AC
  Filled 2022-06-03: qty 2

## 2022-06-03 MED ORDER — FENTANYL CITRATE (PF) 100 MCG/2ML IJ SOLN
INTRAMUSCULAR | Status: AC
  Filled 2022-06-03: qty 2

## 2022-06-03 MED ORDER — ONDANSETRON HCL 4 MG/2ML IJ SOLN
INTRAMUSCULAR | Status: AC
  Filled 2022-06-03: qty 2

## 2022-06-03 MED ORDER — ROCURONIUM BROMIDE 10 MG/ML (PF) SYRINGE
PREFILLED_SYRINGE | INTRAVENOUS | Status: AC
  Filled 2022-06-03: qty 10

## 2022-06-03 MED ORDER — ACETAMINOPHEN 500 MG PO TABS: 1000.0000 mg | ORAL_TABLET | ORAL | Status: AC

## 2022-06-03 MED ORDER — CELECOXIB 200 MG PO CAPS
ORAL_CAPSULE | ORAL | Status: AC
  Administered 2022-06-03: 400 mg via ORAL
  Filled 2022-06-03: qty 2

## 2022-06-03 MED ORDER — HYDROMORPHONE HCL 1 MG/ML IJ SOLN
0.2500 mg | INTRAMUSCULAR | Status: DC | PRN
  Administered 2022-06-03: 0.25 mg via INTRAVENOUS

## 2022-06-03 MED ORDER — GABAPENTIN 300 MG PO CAPS
ORAL_CAPSULE | ORAL | Status: AC
  Administered 2022-06-03: 300 mg via ORAL
  Filled 2022-06-03: qty 1

## 2022-06-03 MED ORDER — LIDOCAINE HCL (CARDIAC) PF 100 MG/5ML IV SOSY
PREFILLED_SYRINGE | INTRAVENOUS | Status: DC | PRN
  Administered 2022-06-03: 80 mg via INTRAVENOUS

## 2022-06-03 SURGICAL SUPPLY — 27 items
ADH SKN CLS APL DERMABOND .7 (GAUZE/BANDAGES/DRESSINGS) ×1
BLADE SURG SZ11 CARB STEEL (BLADE) ×1 IMPLANT
CATH FOLEY 2WAY  5CC 16FR (CATHETERS) ×1
CATH FOLEY 2WAY 5CC 16FR (CATHETERS) ×1
CATH URTH 16FR FL 2W BLN LF (CATHETERS) IMPLANT
DERMABOND ADVANCED .7 DNX12 (GAUZE/BANDAGES/DRESSINGS) ×1 IMPLANT
GLOVE SURG SYN 7.0 (GLOVE) ×6 IMPLANT
GLOVE SURG SYN 7.0 PF PI (GLOVE) IMPLANT
GOWN STRL REUS W/ TWL LRG LVL3 (GOWN DISPOSABLE) ×2 IMPLANT
GOWN STRL REUS W/TWL LRG LVL3 (GOWN DISPOSABLE) ×3
KIT PINK PAD W/HEAD ARE REST (MISCELLANEOUS) ×1
KIT PINK PAD W/HEAD ARM REST (MISCELLANEOUS) ×1 IMPLANT
LABEL OR SOLS (LABEL) ×1 IMPLANT
MANIFOLD NEPTUNE II (INSTRUMENTS) ×1 IMPLANT
NS IRRIG 500ML POUR BTL (IV SOLUTION) ×1 IMPLANT
PACK GYN LAPAROSCOPIC (MISCELLANEOUS) ×1 IMPLANT
PAD OB MATERNITY 4.3X12.25 (PERSONAL CARE ITEMS) ×1 IMPLANT
PAD PREP 24X41 OB/GYN DISP (PERSONAL CARE ITEMS) ×1 IMPLANT
SCRUB CHG 4% DYNA-HEX 4OZ (MISCELLANEOUS) ×1 IMPLANT
SET TUBE SMOKE EVAC HIGH FLOW (TUBING) ×1 IMPLANT
SHEARS ENDO 5MM LNG  ASK BEFOR (MISCELLANEOUS) ×1
SHEARS ENDO 5MM LNG ASK BEFOR (MISCELLANEOUS) ×1 IMPLANT
SUT VIC AB 4-0 SH 27 (SUTURE) ×1
SUT VIC AB 4-0 SH 27XANBCTRL (SUTURE) ×1 IMPLANT
SUT VICRYL 0 UR6 27IN ABS (SUTURE) ×1 IMPLANT
TROCAR XCEL 12X100 BLDLESS (ENDOMECHANICALS) ×1 IMPLANT
WATER STERILE IRR 500ML POUR (IV SOLUTION) ×1 IMPLANT

## 2022-06-03 NOTE — Anesthesia Procedure Notes (Signed)
Procedure Name: Intubation Date/Time: 06/03/2022 2:48 PM  Performed by: Ginger Carne, CRNAPre-anesthesia Checklist: Patient identified, Emergency Drugs available, Suction available, Timeout performed and Patient being monitored Patient Re-evaluated:Patient Re-evaluated prior to induction Oxygen Delivery Method: Circle system utilized Preoxygenation: Pre-oxygenation with 100% oxygen Induction Type: IV induction Ventilation: Mask ventilation without difficulty Laryngoscope Size: McGraph and 3 Grade View: Grade I Tube type: Oral Tube size: 7.0 mm Number of attempts: 1 Airway Equipment and Method: Stylet and Video-laryngoscopy Placement Confirmation: ETT inserted through vocal cords under direct vision, positive ETCO2 and breath sounds checked- equal and bilateral Secured at: 21 cm Tube secured with: Tape Dental Injury: Teeth and Oropharynx as per pre-operative assessment

## 2022-06-03 NOTE — Transfer of Care (Signed)
Immediate Anesthesia Transfer of Care Note  Patient: [name] Woods  Procedure(s) Performed: LAPAROSCOPIC BILATERAL TUBAL LIGATION (Bilateral)  Patient Location: PACU  Anesthesia Type:General  Level of Consciousness: awake and drowsy  Airway & Oxygen Therapy: Patient Spontanous Breathing and Patient connected to face mask oxygen  Post-op Assessment: Report given to RN and Post -op Vital signs reviewed and stable  Post vital signs: Reviewed and stable  Last Vitals:  Vitals Value Taken Time  BP 125/85 06/03/22 1559  Temp 36.3 C 06/03/22 1558  Pulse 83 06/03/22 1600  Resp 20 06/03/22 1600  SpO2 100 % 06/03/22 1600  Vitals shown include unvalidated device data.  Last Pain:  Vitals:   06/03/22 1016  TempSrc: Oral  PainSc: 0-No pain         Complications: No notable events documented.

## 2022-06-03 NOTE — Op Note (Signed)
Procedure(s): LAPAROSCOPIC BILATERAL TUBAL LIGATION Procedure Note  [name] Woods female 36 y.o. 06/03/2022  Indications: The patient is a 36 y.o. G79P2012 female with undesired fertility.    Pre-operative Diagnosis: Undesired fertility  Post-operative Diagnosis: Same  Surgeon: Hildred Laser, MD  Assistants:  None.   Anesthesia: General endotracheal anesthesia  Findings: The uterus was sounded to 9 cm Fallopian tubes and ovaries appeared normal. Filmy adhesions of the left ovary and small bowel to the posterior aspect of the uterus  Procedure Details: The patient was seen in the Holding Room. The risks, benefits, complications, treatment options, and expected outcomes were discussed with the patient.  The patient concurred with the proposed plan, giving informed consent.  The site of surgery properly noted/marked. The patient was taken to the Operating Room, identified as [name] Woods and the procedure verified as Procedure(s) (LRB): LAPAROSCOPIC BILATERAL TUBAL LIGATION (Bilateral).   She was then placed under general anesthesia without difficulty. She was placed in the dorsal lithotomy position, and was prepped and draped in a sterile manner.  A Time Out was held and the above information confirmed.  A straight catheterization was performed. A sterile speculum was inserted into the vagina and the cervix was grasped at the anterior lip using a single-toothed tenaculum.  The uterus was sounded to 9 cm, and a Hulka clamp was placed for uterine manipulation.  The speculum and tenaculum were then removed. Attention was then turned to the patient's abdomen where a 11-mm skin incision was made in the umbilical fold.  The Optiview 11-mm trocar and sleeve were then advanced without difficulty with the Doblegg laparoscope under direct visualization into the abdomen.  The abdomen was then insufflated with carbon dioxide gas and adequate pneumoperitoneum was obtained.  A survey of the  patient's pelvis and abdomen revealed entirely normal anatomy with the exception of filmy adhesions of the left ovary and small bowel to the posterior surface of the uterus.   The fallopian tubes were observed and found to be normal in appearance. Kleppinger forceps was then advanced through the operative port and used to coagulate a 3 cm portion of the left tube in the mid isthmic area.  Good blanching and coagulation was noted at the site of the application.  There was no bleeding noted in the mesosalpinx.  A similar process was carried out on the right fallopian tube allowing for bilateral tubal sterilization.   Good hemostasis was noted overall.  Laparoscopic endoshears where then advanced into the laparoscopic port and the cauterized areas of the fallopian tubes were incised bilaterally. The instruments were then removed from the patient's abdomen and the fascial incision was repaired with 0 Vicryl, and the skin was closed with a subcuticular stitch of 4-0 Vicryl and Dermabond.  The uterine manipulator and the tenaculum were removed from the vagina without complications. The patient tolerated the procedure well.  Sponge, lap, and needle counts were correct times two.  The patient was then taken to the recovery room awake, extubated and in stable condition.   Estimated Blood Loss:  minimal      Drains: straight catheterization prior to procedure with 30 ml of clear urine         Total IV Fluids:  700 ml  Specimens: None         Implants: None         Complications:  None; patient tolerated the procedure well.         Disposition: PACU - hemodynamically stable.  Condition: stable   [name], MD [facility] at [facility]
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
## 2022-06-03 NOTE — Anesthesia Postprocedure Evaluation (Signed)
Anesthesia Post Note  Patient: Jennalyn Cawley  Procedure(s) Performed: LAPAROSCOPIC BILATERAL TUBAL LIGATION (Bilateral)  Patient location during evaluation: PACU Anesthesia Type: General Level of consciousness: awake and alert Pain management: pain level controlled Vital Signs Assessment: post-procedure vital signs reviewed and stable Respiratory status: spontaneous breathing, nonlabored ventilation, respiratory function stable and patient connected to nasal cannula oxygen Cardiovascular status: blood pressure returned to baseline and stable Postop Assessment: no apparent nausea or vomiting Anesthetic complications: no   No notable events documented.   Last Vitals:  Vitals:   06/03/22 1559 06/03/22 1600  BP: 125/85 120/79  Pulse: 88 85  Resp: (!) 25 (!) 21  Temp:    SpO2: 100% 100%    Last Pain:  Vitals:   06/03/22 1600  TempSrc:   PainSc: Asleep                 Louie Boston
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
## 2022-06-03 NOTE — Discharge Instructions (Signed)

## 2022-06-04 ENCOUNTER — Encounter: Payer: Self-pay | Admitting: Obstetrics and Gynecology

## 2022-06-05 ENCOUNTER — Telehealth: Payer: Self-pay

## 2022-06-05 NOTE — Telephone Encounter (Signed)
Pt called triage and said she recently had surgery, she is to return to work on 06/09/22 and she needs that date to be changed/extended. She states she is not ready to go back this soon. She went to her kids school winter celebration today and she said she is pain. Please advise.

## 2022-06-06 NOTE — Telephone Encounter (Signed)
Patient is calling office back and is requesting a response back before the end of business day. Please advise, patient reports abdominal pain that she describes as moderate to severe. Patient states that she has been short of breath on exertion, she denies running fever and denies nausea and vomiting. Patient is reporting episodes of dizziness but believes it is associated with Oxycodone. Please advise if note is appropriate to extend and for how long patient should be out of work. KW

## 2022-06-06 NOTE — Telephone Encounter (Signed)
Letter has been extended effective 06/09/22-06/18/22 at next patient follow up visit. Patient has been advised of Dr. Chalmers Guest message below and letter has been left upfront for pick up. KW

## 2022-06-18 ENCOUNTER — Encounter: Payer: Self-pay | Admitting: Obstetrics and Gynecology

## 2022-06-18 ENCOUNTER — Ambulatory Visit (INDEPENDENT_AMBULATORY_CARE_PROVIDER_SITE_OTHER): Payer: BC Managed Care – PPO | Admitting: Obstetrics and Gynecology

## 2022-06-18 VITALS — BP 129/59 | HR 81 | Resp 16 | Ht 64.0 in | Wt 221.0 lb

## 2022-06-18 DIAGNOSIS — Z4889 Encounter for other specified surgical aftercare: Secondary | ICD-10-CM

## 2022-06-18 DIAGNOSIS — E669 Obesity, unspecified: Secondary | ICD-10-CM

## 2022-06-18 DIAGNOSIS — Z9851 Tubal ligation status: Secondary | ICD-10-CM

## 2022-06-18 MED ORDER — PHENTERMINE HCL 30 MG PO CAPS: 30.0000 mg | ORAL_CAPSULE | ORAL | 0 refills | Status: DC

## 2022-06-18 NOTE — Progress Notes (Signed)
    OBSTETRICS/GYNECOLOGY POST-OPERATIVE CLINIC VISIT  Subjective:     [name] Woods is a 37 y.o. female who presents to the clinic 2 weeks status post [place]  for  undesired fertility . Eating a regular diet without difficulty, has a loss of appetite Bowel movements are normal. Pain is controlled with current analgesics. Medications being used: acetaminophen.  Desires to resume weight loss efforts. Would like to be restarted on Phentermine. Was on medication in April-June of last year for several months and felt like it was helpful.  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 Notes that she stopped using the medication when she had other social and family issues going on, and was unable to keep on her regimen.   The following portions of the patient's history were reviewed and updated as appropriate: allergies, current medications, past family history, past medical history, past social history, past surgical history, and problem list.  Review of Systems Pertinent items noted in HPI and remainder of comprehensive ROS otherwise negative.   Objective:   LMP 05/13/2022 (Exact Date)  Body mass index is 37.93 kg/m. Blood pressure (!) 129/59, pulse 81, resp. rate 16, height 5\' 4"  (1.626 m), weight 221 lb (100.2 kg), last menstrual period 05/13/2022.   General:  alert and no distress  Abdomen: soft, bowel sounds active, non-tender  Incision:   healing well, no drainage, no erythema, no hernia, no seroma, no swelling, no dehiscence, incision well approximated.     Pathology:  None  Assessment:   Patient s/p LAPAROSCOPIC BILATERAL TUBAL LIGATION  (surgery)  Doing well postoperatively. Obesity   Plan:   1. Continue any current medications as instructed by provider. 2. Wound care discussed. 3. Operative findings again reviewed. Pathology report discussed. 4. Activity restrictions:  none 5. Anticipated return to work: now. 6. Patient desires to resume weight loss management. Discussed risks/benefits of use of phentermine.  Patient notes understanding. Will prescribe 30 mg. Discussed dietary and lifestyle modifications. To f/u in 1 month for weight check. Also will refer to Nutritionist as patient notes difficulty maintaining an appropriate dietary plan after weight loss has been completed.  Follow up: 1  month  for weight loss.    [name], MD [place]

## 2022-06-19 LAB — LIPID PANEL
Chol/HDL Ratio: 3 ratio (ref 0.0–4.4)
Cholesterol, Total: 192 mg/dL (ref 100–199)
HDL: 63 mg/dL (ref >39)
LDL Chol Calc (NIH): 120 mg/dL — ABNORMAL HIGH (ref 0–99)
Triglycerides: 48 mg/dL (ref 0–149)
VLDL Cholesterol Cal: 9 mg/dL (ref 5–40)

## 2022-06-19 LAB — TSH: TSH: 1.76 u[IU]/mL (ref 0.450–4.500)

## 2022-06-19 LAB — HEMOGLOBIN A1C
Est. average glucose Bld gHb Est-mCnc: 105 mg/dL
Hgb A1c MFr Bld: 5.3 % (ref 4.8–5.6)

## 2022-06-20 ENCOUNTER — Telehealth: Payer: Self-pay

## 2022-06-20 MED ORDER — PHENTERMINE HCL 30 MG PO CAPS: 30.0000 mg | ORAL_CAPSULE | ORAL | 0 refills | Status: DC

## 2022-06-20 NOTE — Telephone Encounter (Signed)
Pt calling; [facility] doesn't have phentermine but [facility] does; can it be sent there instead.  Adv would send it; to call them before she makes the trip to p/u.

## 2022-06-25 ENCOUNTER — Encounter: Payer: Self-pay | Admitting: Obstetrics and Gynecology

## 2022-07-17 ENCOUNTER — Ambulatory Visit: Payer: BC Managed Care – PPO | Admitting: Obstetrics and Gynecology

## 2022-08-12 ENCOUNTER — Ambulatory Visit: Payer: BC Managed Care – PPO | Admitting: Dietician

## 2022-08-23 ENCOUNTER — Ambulatory Visit: Payer: BC Managed Care – PPO

## 2022-08-26 ENCOUNTER — Ambulatory Visit: Payer: BC Managed Care – PPO

## 2022-08-26 NOTE — Progress Notes (Deleted)
    NURSE VISIT NOTE  Subjective:    Patient ID: [name] Woods, female    DOB: 1985-11-21, 37 y.o.   MRN: [id number]  HPI  Patient is a 37 y.o. G7P2012 female who presents for {pe vag discharge desc:315065} vaginal discharge for *** {gen duration:315003}. Denies abnormal vaginal bleeding or significant pelvic pain or fever. {Actions; denies/reports/admits to:19208} {UTI Symptoms:210800002}. Patient {has/denies:315300} history of known exposure to STD.   Objective:    There were no vitals taken for this visit.   @THIS  VISIT ONLY@  Assessment:   No diagnosis found.  {vaginitis type:315262}  Plan:   GC and chlamydia DNA  probe sent to lab. Treatment: {vaginitis tx:315263} ROV prn if symptoms persist or worsen.   Quintella Baton, CMA

## 2022-08-28 ENCOUNTER — Ambulatory Visit (INDEPENDENT_AMBULATORY_CARE_PROVIDER_SITE_OTHER): Payer: Medicaid Other

## 2022-08-28 ENCOUNTER — Other Ambulatory Visit (HOSPITAL_COMMUNITY)
Admission: RE | Admit: 2022-08-28 | Discharge: 2022-08-28 | Disposition: A | Discharge status: Home / Self Care | Payer: Medicaid Other | Source: Ambulatory Visit | Attending: Obstetrics & Gynecology | Admitting: Obstetrics & Gynecology

## 2022-08-28 ENCOUNTER — Ambulatory Visit (INDEPENDENT_AMBULATORY_CARE_PROVIDER_SITE_OTHER): Payer: Medicaid Other | Admitting: Obstetrics & Gynecology

## 2022-08-28 VITALS — BP 118/80 | Ht 64.0 in | Wt 220.0 lb

## 2022-08-28 VITALS — BP 118/86 | Wt 220.0 lb

## 2022-08-28 DIAGNOSIS — R748 Abnormal levels of other serum enzymes: Secondary | ICD-10-CM

## 2022-08-28 DIAGNOSIS — R87810 Cervical high risk human papillomavirus (HPV) DNA test positive: Secondary | ICD-10-CM

## 2022-08-28 DIAGNOSIS — Z124 Encounter for screening for malignant neoplasm of cervix: Secondary | ICD-10-CM

## 2022-08-28 DIAGNOSIS — Z113 Encounter for screening for infections with a predominantly sexual mode of transmission: Secondary | ICD-10-CM | POA: Diagnosis present

## 2022-08-28 DIAGNOSIS — Z01419 Encounter for gynecological examination (general) (routine) without abnormal findings: Secondary | ICD-10-CM
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
## 2022-08-28 NOTE — Progress Notes (Signed)
    NURSE VISIT NOTE  Subjective:    Patient ID: [name] Woods, female    DOB: 02-12-1986, 37 y.o.   MRN: [id number]  HPI  Patient is a 37 y.o. G58P2012 female who presents for a nurse visit for self swab routine STD testing. Denies abnormal vaginal bleeding or significant pelvic pain or fever. Patient has noticed a vaginal odor during her cycle the last few months. Patient thought she was seeing a provider to get pelvic exam and blood work. Checked with Dr. Hulan Woods and she agreed to see the patient since she hasn't had an annual since 05/2020.  Objective:    BP 118/80   Ht 5\' 4"  (1.626 m)   Wt 220 lb (99.8 kg)   LMP 08/22/2022 (Exact Date)   BMI 37.76 kg/m    @THIS  VISIT ONLY@  Assessment:   1. Screening for STD (sexually transmitted disease)      Plan:   GC and chlamydia DNA probe sent to lab. Treatment: Will wait for test results to be treated, if needed. ROV prn if symptoms persist or worsen.   [name] Woods, CMA

## 2022-08-30 LAB — CERVICOVAGINAL ANCILLARY ONLY
Bacterial Vaginitis (gardnerella): NEGATIVE
Candida Glabrata: NEGATIVE
Candida Vaginitis: NEGATIVE
Chlamydia: NEGATIVE
Comment: NEGATIVE
Comment: NEGATIVE
Comment: NEGATIVE
Comment: NEGATIVE
Comment: NEGATIVE
Comment: NORMAL
Neisseria Gonorrhea: NEGATIVE
Trichomonas: NEGATIVE

## 2022-09-04 ENCOUNTER — Telehealth: Payer: Self-pay

## 2022-09-04 NOTE — Telephone Encounter (Signed)
The patient is calling to follow up on message left. The patient is requesting a call back to to clarify her results. The patient is aware Dr. Hulan Fray is out of the office and message has been routed to Dr. Marcelline Mates whom is in the office see patients.

## 2022-09-04 NOTE — Telephone Encounter (Signed)
Contacted patient regarding her concerns about her pap smear results.  Patient with positive HPV (no typing) for the past several years. Is feeling overwhelmed and concerned.  Discussed HPV in detail, advised on it's role in cervical cancer.  Counseled on HPV vaccine. Also discussed that she could have HPV typing performed to assess if there should be further follow up.  Patient notes that [facility] but would [facility] come to [place] and have HPV testing and receive vaccine. Will have patient f/u tomorrow at 3:15.

## 2022-09-04 NOTE — Telephone Encounter (Signed)
Pt calling to discuss pap smear results; positive HPV.  [phone]

## 2022-09-05 ENCOUNTER — Ambulatory Visit: Payer: Medicaid Other

## 2022-09-05 ENCOUNTER — Ambulatory Visit (INDEPENDENT_AMBULATORY_CARE_PROVIDER_SITE_OTHER): Payer: Medicaid Other

## 2022-09-05 VITALS — BP 110/60 | Ht 64.0 in | Wt 218.0 lb

## 2022-09-05 DIAGNOSIS — Z23 Encounter for immunization: Secondary | ICD-10-CM | POA: Diagnosis not present

## 2022-09-05 LAB — CYTOLOGY - PAP
Comment: NEGATIVE
Comment: NEGATIVE
Comment: NEGATIVE
Diagnosis: NEGATIVE
HPV 16: NEGATIVE
HPV 18 / 45: NEGATIVE
High risk HPV: POSITIVE — AB

## 2022-09-05 LAB — HEPATIC FUNCTION PANEL
ALT: 17 IU/L (ref 0–32)
AST: 12 IU/L (ref 0–40)
Albumin: 4 g/dL (ref 3.9–4.9)
Alkaline Phosphatase: 63 IU/L (ref 44–121)
Bilirubin Total: 0.3 mg/dL (ref 0.0–1.2)
Bilirubin, Direct: 0.12 mg/dL (ref 0.00–0.40)
Total Protein: 6.9 g/dL (ref 6.0–8.5)

## 2022-09-05 LAB — HEPATITIS B SURFACE ANTIGEN: Hepatitis B Surface Ag: NEGATIVE

## 2022-09-05 LAB — HSV 1 AND 2 AB, IGG
HSV 1 Glycoprotein G Ab, IgG: 46.7 index — ABNORMAL HIGH (ref 0.00–0.90)
HSV 2 IgG, Type Spec: <0.91 index (ref 0.00–0.90)

## 2022-09-05 LAB — HEPATITIS C ANTIBODY: Hep C Virus Ab: NONREACTIVE

## 2022-09-05 LAB — RPR: RPR Ser Ql: NONREACTIVE

## 2022-09-05 LAB — HIV ANTIBODY (ROUTINE TESTING W REFLEX): HIV Screen 4th Generation wRfx: NONREACTIVE

## 2022-09-05 NOTE — Progress Notes (Addendum)
    NURSE VISIT NOTE  Subjective:    Patient ID: [name] Woods, female    DOB: [birth year], 37 y.o.   MRN: [id number]  HPI  Patient is a 37 y.o. G45P2012 female Divorced [place] American female who presents for her third Gardasil injection. Order to administer given by [name], MD on 09/05/22.   Objective:    LMP 08/22/2022 (Exact Date)   37 y.o. LMP:  08/22/22  Contraception:   none Given by: Quintella Baton, CMA Site:  left deltoid  Lab Review  @THIS  VISIT ONLY@  Assessment:   No diagnosis found.   Plan:   Patient will return in prn. Gardasil series complete. for third injection.    Quintella Baton, CMA

## 2022-09-05 NOTE — Patient Instructions (Signed)
Human Papillomavirus (HPV) Vaccine Injection What is this medication? HUMAN PAPILLOMAVIRUS VACCINE (HYOO muhn pap uh LOH muh vahy ruhs vak SEEN) reduces the risk of human papillomavirus (HPV). It does not treat HPV. It is still possible to get HPV after receiving this vaccine, but the symptoms may be less severe or not last as long. It works by helping your immune system learn how to fight off a future infection. This medicine may be used for other purposes; ask your health care provider or pharmacist if you have questions. COMMON BRAND NAME(S): Gardasil 9 What should I tell my care team before I take this medication? They need to know if you have any of these conditions: Fever Hemophilia HIV or AIDS Immune system problems Infection Low platelets An unusual reaction to human papillomavirus vaccine, yeast, other vaccines, other medications, foods, dyes, or preservatives Pregnant or trying to get pregnant Breastfeeding How should I use this medication? This vaccine is injected into a muscle. It is given by your care team. This vaccine requires 2 or 3 doses to get the full benefit. Set a reminder for when your next dose is due. A copy of the Vaccine Information Statement will be given before each vaccination. Be sure to read this information carefully each time. This sheet may change often. Talk to your care team about the use of this medication in children. While it may be prescribed for children as young as 9 years for selected conditions, precautions do apply. Overdosage: If you think you have taken too much of this medicine contact a poison control center or emergency room at once. NOTE: This medicine is only for you. Do not share this medicine with others. What if I miss a dose? Keep appointments for follow-up doses as directed. It is important not to miss your dose. Call your care team if you are unable to keep an appointment. What may interact with this medication? Certain medications  for arthritis Medications for organ transplant Medications to treat cancer Steroid medications, such as prednisone or cortisone This list may not describe all possible interactions. Give your health care provider a list of all the medicines, herbs, non-prescription drugs, or dietary supplements you use. Also tell them if you smoke, drink alcohol, or use illegal drugs. Some items may interact with your medicine. What should I watch for while using this medication? Visit your care team regularly. Report any side effects to your care team right away. This vaccine, like all vaccines, may not fully protect everyone. What side effects may I notice from receiving this medication? Side effects that you should report to your care team as soon as possible: Allergic reactions--skin rash, itching, hives, swelling of the face, lips, tongue, or throat Feeling faint or lightheaded Side effects that usually do not require medical attention (report these to your care team if they continue or are bothersome): Diarrhea Dizziness Fatigue Fever Headache Nausea Pain, redness, irritation, or bruising at the injection site This list may not describe all possible side effects. Call your doctor for medical advice about side effects. You may report side effects to FDA at 1-800-FDA-1088. Where should I keep my medication? This vaccine is only given by your care team. It will not be stored at home. NOTE: This sheet is a summary. It may not cover all possible information. If you have questions about this medicine, talk to your doctor, pharmacist, or health care provider.  2023 Elsevier/Gold Standard (2021-11-13 00:00:00)  

## 2022-09-27 ENCOUNTER — Encounter: Payer: Self-pay | Admitting: Obstetrics & Gynecology

## 2022-10-17 ENCOUNTER — Telehealth: Payer: Self-pay

## 2022-10-17 NOTE — Telephone Encounter (Signed)
Patient is calling to schedule a medication refill appointment.  Patient last saw Dr. Marice Potter for wellness.  Please advice if you willing to take on this patient while  Dr. Marice Potter is out of the office.

## 2022-10-17 NOTE — Telephone Encounter (Signed)
Patient contacted office requesting refill on Alprazolam. Patient was last seen in office 08/28/22, alprazolam was last filled 12/24/17. Patient states that she has had increased symptoms of anxiety over the past months due to transition with new job and helping her younger brother. Patient advised office visit is appropriate to reassess for symptoms of depression/anxiety and to discuss medication options that would be appropriate. KW

## 2022-10-17 NOTE — Telephone Encounter (Signed)
That's fine. I've seen her before a few times. I can see her to discuss her issues and different medication options for her. Can be virtual or in person.

## 2022-10-22 NOTE — Progress Notes (Unsigned)
    GYNECOLOGY PROGRESS NOTE  Subjective:    Patient ID: [name] Woods, female    DOB: 1985/08/23, 37 y.o.   MRN: [id number]  HPI  Patient is a 37 y.o. G64P2012 female who presents for evaluation of anxiety.   {Common ambulatory SmartLinks:19316}  Review of Systems {ros; complete:30496}   Objective:   There were no vitals taken for this visit. There is no height or weight on file to calculate BMI. General appearance: {general exam:16600} Abdomen: {abdominal exam:16834} Pelvic: {pelvic exam:16852::"cervix normal in appearance","external genitalia normal","no adnexal masses or tenderness","no cervical motion tenderness","rectovaginal septum normal","uterus normal size, shape, and consistency","vagina normal without discharge"} Extremities: {extremity exam:5109} Neurologic: {neuro exam:17854}   Assessment:   No diagnosis found.   Plan:   There are no diagnoses linked to this encounter.    Hildred Laser, MD [facility]

## 2022-10-23 ENCOUNTER — Telehealth: Payer: Medicaid Other | Admitting: Obstetrics and Gynecology

## 2022-10-23 ENCOUNTER — Encounter: Payer: Self-pay | Admitting: Obstetrics and Gynecology

## 2022-10-23 VITALS — Ht 64.0 in | Wt 212.0 lb

## 2022-10-23 DIAGNOSIS — F32A Depression, unspecified: Secondary | ICD-10-CM

## 2022-10-23 DIAGNOSIS — F410 Panic disorder [episodic paroxysmal anxiety] without agoraphobia: Secondary | ICD-10-CM | POA: Diagnosis not present

## 2022-10-23 DIAGNOSIS — F419 Anxiety disorder, unspecified: Secondary | ICD-10-CM | POA: Diagnosis not present

## 2022-10-23 DIAGNOSIS — F430 Acute stress reaction: Secondary | ICD-10-CM | POA: Diagnosis not present

## 2022-10-23 MED ORDER — LORAZEPAM 0.5 MG PO TABS: 0.5000 mg | ORAL_TABLET | Freq: Three times a day (TID) | ORAL | 1 refills | Status: AC

## 2022-10-23 MED ORDER — ESCITALOPRAM OXALATE 10 MG PO TABS: 10.0000 mg | ORAL_TABLET | Freq: Every day | ORAL | 1 refills | Status: AC

## 2022-10-23 NOTE — Patient Instructions (Addendum)
Website for finding a therapist - [email or web address]
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
## 2022-11-20 ENCOUNTER — Telehealth: Payer: Medicaid Other | Admitting: Obstetrics and Gynecology

## 2022-11-28 ENCOUNTER — Telehealth (INDEPENDENT_AMBULATORY_CARE_PROVIDER_SITE_OTHER): Payer: Medicaid Other | Admitting: Obstetrics and Gynecology

## 2022-11-28 ENCOUNTER — Encounter: Payer: Self-pay | Admitting: Obstetrics and Gynecology

## 2022-11-28 DIAGNOSIS — F32A Depression, unspecified: Secondary | ICD-10-CM | POA: Diagnosis not present

## 2022-11-28 DIAGNOSIS — F419 Anxiety disorder, unspecified: Secondary | ICD-10-CM

## 2022-11-28 NOTE — Progress Notes (Signed)
Virtual Visit via Video Note  I connected with [name] Woods on 10/23/22 at  11:15 AM EDT by a video enabled telemedicine application and verified that I am speaking with the correct person using two identifiers.  Location: Patient: Home Provider: office   I discussed the limitations of evaluation and management by telemedicine and the availability of in person appointments. The patient expressed understanding and agreed to proceed.    Subjective:    Patient ID: [name] Woods, female    DOB: [birth year], 37 y.o.   MRN: [id number]  HPI  Patient is a 37 y.o. G76P2012 female who presents for f/u anxiety and mood symptoms.  Was initiate on Lexapro last visit but felt that it made her mood symptoms worse, also was beginning to feel more depressed and paranoid.  Tried medication for 2-3 days.  States that she has been listening to a lot of podcasts to help with her mood.  Notes that she is now planning on moving due to safety issues (feels like this will definitely help with her mood issues).   Also notes that cycle has been different. Usually a few days variance, but lately it's been shorter and irregular since April.  Took UPT and was negatice. Does note diet is not good right now (not making healthy choices).    The following portions of the patient's history were reviewed and updated as appropriate: allergies, current medications, past family history, past medical history, past social history, past surgical history, and problem list.  Review of Systems Pertinent items noted in HPI and remainder of comprehensive ROS otherwise negative.   Objective:   Height 5\' 4"  (1.626 m), weight 212 lb (96.2 kg). Body mass index is 36.39 kg/m. General appearance: alert and no distress HEENT: Head normocephalic, atraumatic.  Neurologic: Grossly normal Psych: normal mood and affect.        11/28/2022    3:55 PM 10/23/2022   11:19 AM  GAD 7 : Generalized Anxiety Score  Nervous, Anxious, on  Edge 0 1  Control/stop worrying 3 3  Worry too much - different things 1 3  Trouble relaxing 1 3  Restless 0 1  Easily annoyed or irritable 1 3  Afraid - awful might happen 1 3  Total GAD 7 Score 7 17  Anxiety Difficulty Somewhat difficult     .    11/28/2022    3:53 PM 10/23/2022   11:39 AM  Depression screen PHQ 2/9  Decreased Interest 0 3  Down, Depressed, Hopeless 1 1  PHQ - 2 Score 1 4  Altered sleeping 1 3  Tired, decreased energy 1 1  Change in appetite 3 1  Feeling bad or failure about yourself  1   Trouble concentrating 0 0  Moving slowly or fidgety/restless 1 1  Suicidal thoughts 0 0  PHQ-9 Score 8 10  Difficult doing work/chores Not difficult at all Somewhat difficult      Assessment:   1. Anxiety and depression    Plan:   - Patient unable to tolerate prescribed medications.  Trying other altenatives to help with symptoms. Desires to try more natural options. Given list in AVS of natural mood boosters. Overall GAD-7 and PHQ-9 scores have improved with tried interventions outside of medications. Advised that she can continue.  - Patient to try natural herbal supplements, to f/u in 1 month if symptoms do not improve.    Follow Up Instructions:     I discussed the assessment and treatment plan  with the patient. The patient was provided an opportunity to ask questions and all were answered. The patient agreed with the plan and demonstrated an understanding of the instructions.   The patient was advised to call back or seek an in-person evaluation if the symptoms worsen or if the condition fails to improve as anticipated.   I provided 16 minutes of non-face-to-face time during this encounter.     Hildred Laser, MD [facility]

## 2022-11-28 NOTE — Patient Instructions (Signed)
Herbal Mood Supplements  St. John's wort. This herbal supplement is not approved by the Food and Drug Administration (FDA) to treat depression in the [place] but it's available. Although it may be helpful for mild or moderate depression, use it with caution. St. John's wort can interfere with many medications, including blood-thinning drugs, birth control pills, chemotherapy, HIV/AIDS medications and drugs to prevent organ rejection after a transplant. Also, avoid taking St. John's wort while taking antidepressants -- the combination can cause serious side effects.  SAMe. This dietary supplement is a synthetic form of a chemical that occurs naturally in the body. SAMe (pronounced sam-E) is short for S-adenosylmethionine (es-uh-den-o-sul-muh-THIE-o-neen). SAMe is not approved by the FDA to treat depression in the [place] though it's available. More research is needed to determine if SAMe is helpful for depression. In higher doses, SAMe can cause nausea and constipation. Do not use SAMe if you're taking a prescription antidepressant -- the combination may lead to serious side effects. SAMe may trigger mania in people with bipolar disorder.  Omega-3 fatty acids. These fats are found in cold-water fish, flaxseed, flax oil, walnuts and some other foods. Omega-3 supplements are being studied as a possible treatment for depression and for depressive symptoms in people with bipolar disorder. While considered generally safe, the supplement can have a fishy taste, and in high doses, it may interact with other medications. Although eating foods with omega-3 fatty acids appears to have heart-healthy benefits, more research is needed to determine if it has an effect on preventing or improving depression.  Saffron. Saffron extract may improve symptoms of depression, but more study is needed. High doses can cause significant side effects.  5-HTP. The supplement called 5-hydroxytryptophan (hi-drok-see-TRIP-to-fan), also known  as 5-HTP, may play a role in improving serotonin levels, a chemical that affects mood. But evidence is only preliminary and more research is needed.  DHEA. Dehydroepiandrosterone (dee-hi-droe-ep-e-an-DROS-tur-own), also called DHEA, is a hormone that your body makes. Changes in levels of DHEA have been linked to depression. Several preliminary studies show improvement in depression symptoms when taking DHEA as a dietary supplement, but more research is needed. Although it's usually well-tolerated, DHEA has potentially side effects, especially if used in high doses or long term. DHEA made from soy or wild yam is not effective.  Ashwaganda.  The ashwagandha plant is a small shrub with yellow flowers that's native to [place] and [place]. People use extracts or powder from the plant's root or leaves to treat a variety of conditions, including anxiety and fertility issues. Potential benefits of ashwagandha include better athletic performance and sleep. Some research suggests this herb may help people with conditions like anxiety and infertility, but stronger studies are needed.   

## 2023-02-27 ENCOUNTER — Telehealth: Payer: Self-pay

## 2023-02-27 NOTE — Telephone Encounter (Signed)
Pt called triage asking if we can tell her when was her last TDAP. Advised 05/31/2019.
